# Patient Record
Sex: Male | Born: 1955 | Race: White | Hispanic: No | Marital: Married | Smoking: Former smoker
Health system: Southern US, Community
[De-identification: ages and names within clinical notes are randomized; demographics above are authoritative.]

## PROBLEM LIST (undated history)

## (undated) DIAGNOSIS — E785 Hyperlipidemia, unspecified: Secondary | ICD-10-CM

## (undated) HISTORY — PX: TONSILLECTOMY AND ADENOIDECTOMY: SUR1326

## (undated) HISTORY — PX: HERNIA REPAIR: SHX51

## (undated) HISTORY — DX: Hyperlipidemia, unspecified: E78.5

---

## 1998-07-04 HISTORY — PX: KNEE ARTHROSCOPY: SUR90

## 1999-04-16 ENCOUNTER — Encounter: Admission: RE | Admit: 1999-04-16 | Discharge: 1999-06-03 | Payer: Self-pay | Admitting: Family Medicine

## 2002-07-13 ENCOUNTER — Ambulatory Visit (HOSPITAL_COMMUNITY): Admission: RE | Admit: 2002-07-13 | Discharge: 2002-07-13 | Payer: Self-pay | Admitting: Family Medicine

## 2002-07-13 ENCOUNTER — Encounter: Payer: Self-pay | Admitting: Family Medicine

## 2006-06-12 ENCOUNTER — Ambulatory Visit: Payer: Self-pay | Admitting: Internal Medicine

## 2006-06-15 ENCOUNTER — Ambulatory Visit: Payer: Self-pay | Admitting: Internal Medicine

## 2009-11-28 ENCOUNTER — Emergency Department (HOSPITAL_COMMUNITY): Admission: EM | Admit: 2009-11-28 | Discharge: 2009-11-28 | Payer: Self-pay | Admitting: Family Medicine

## 2009-12-29 ENCOUNTER — Encounter: Admission: RE | Admit: 2009-12-29 | Discharge: 2009-12-29 | Payer: Self-pay | Admitting: Orthopedic Surgery

## 2010-09-20 LAB — ROCKY MTN SPOTTED FVR AB, IGG-BLOOD: RMSF IgG: 1.08 IV — ABNORMAL HIGH

## 2010-09-20 LAB — B. BURGDORFI ANTIBODIES: B burgdorferi Ab IgG+IgM: 0.54 {ISR}

## 2012-09-19 ENCOUNTER — Encounter: Payer: Self-pay | Admitting: General Practice

## 2012-09-19 ENCOUNTER — Ambulatory Visit (INDEPENDENT_AMBULATORY_CARE_PROVIDER_SITE_OTHER): Payer: BC Managed Care – PPO | Admitting: General Practice

## 2012-09-19 VITALS — BP 119/81 | HR 60 | Temp 97.2°F | Ht 71.0 in | Wt 207.0 lb

## 2012-09-19 DIAGNOSIS — J020 Streptococcal pharyngitis: Secondary | ICD-10-CM

## 2012-09-19 DIAGNOSIS — J329 Chronic sinusitis, unspecified: Secondary | ICD-10-CM | POA: Insufficient documentation

## 2012-09-19 MED ORDER — AMOXICILLIN 500 MG PO CAPS: 500.0000 mg | ORAL_CAPSULE | Freq: Two times a day (BID) | ORAL | Status: DC

## 2012-09-19 NOTE — Patient Instructions (Signed)
Sinusitis Sinusitis is redness, soreness, and swelling (inflammation) of the paranasal sinuses. Paranasal sinuses are air pockets within the bones of your face (beneath the eyes, the middle of the forehead, or above the eyes). In healthy paranasal sinuses, mucus is able to drain out, and air is able to circulate through them by way of your nose. However, when your paranasal sinuses are inflamed, mucus and air can become trapped. This can allow bacteria and other germs to grow and cause infection. Sinusitis can develop quickly and last only a short time (acute) or continue over a long period (chronic). Sinusitis that lasts for more than 12 weeks is considered chronic.  CAUSES  Causes of sinusitis include:  Allergies.  Structural abnormalities, such as displacement of the cartilage that separates your nostrils (deviated septum), which can decrease the air flow through your nose and sinuses and affect sinus drainage.  Functional abnormalities, such as when the small hairs (cilia) that line your sinuses and help remove mucus do not work properly or are not present. SYMPTOMS  Symptoms of acute and chronic sinusitis are the same. The primary symptoms are pain and pressure around the affected sinuses. Other symptoms include:  Upper toothache.  Earache.  Headache.  Bad breath.  Decreased sense of smell and taste.  A cough, which worsens when you are lying flat.  Fatigue.  Fever.  Thick drainage from your nose, which often is green and may contain pus (purulent).  Swelling and warmth over the affected sinuses. DIAGNOSIS  Your caregiver will perform a physical exam. During the exam, your caregiver may:  Look in your nose for signs of abnormal growths in your nostrils (nasal polyps).  Tap over the affected sinus to check for signs of infection.  View the inside of your sinuses (endoscopy) with a special imaging device with a light attached (endoscope), which is inserted into your  sinuses. If your caregiver suspects that you have chronic sinusitis, one or more of the following tests may be recommended:  Allergy tests.  Nasal culture A sample of mucus is taken from your nose and sent to a lab and screened for bacteria.  Nasal cytology A sample of mucus is taken from your nose and examined by your caregiver to determine if your sinusitis is related to an allergy. TREATMENT  Most cases of acute sinusitis are related to a viral infection and will resolve on their own within 10 days. Sometimes medicines are prescribed to help relieve symptoms (pain medicine, decongestants, nasal steroid sprays, or saline sprays).  However, for sinusitis related to a bacterial infection, your caregiver will prescribe antibiotic medicines. These are medicines that will help kill the bacteria causing the infection.  Rarely, sinusitis is caused by a fungal infection. In theses cases, your caregiver will prescribe antifungal medicine. For some cases of chronic sinusitis, surgery is needed. Generally, these are cases in which sinusitis recurs more than 3 times per year, despite other treatments. HOME CARE INSTRUCTIONS   Drink plenty of water. Water helps thin the mucus so your sinuses can drain more easily.  Use a humidifier.  Inhale steam 3 to 4 times a day (for example, sit in the bathroom with the shower running).  Apply a warm, moist washcloth to your face 3 to 4 times a day, or as directed by your caregiver.  Use saline nasal sprays to help moisten and clean your sinuses.  Take over-the-counter or prescription medicines for pain, discomfort, or fever only as directed by your caregiver. SEEK IMMEDIATE MEDICAL   CARE IF:  You have increasing pain or severe headaches.  You have nausea, vomiting, or drowsiness.  You have swelling around your face.  You have vision problems.  You have a stiff neck.  You have difficulty breathing. MAKE SURE YOU:   Understand these  instructions.  Will watch your condition.  Will get help right away if you are not doing well or get worse. Document Released: 06/20/2005 Document Revised: 09/12/2011 Document Reviewed: 07/05/2011 [facility] Patient Information 2013 [place], [place].  Sore Throat Sore throats may be caused by bacteria and viruses. They may also be caused by:  Smoking.  Pollution.  Allergies. If a sore throat is due to strep infection (a bacterial infection), you may need:  A throat swab.  A culture test to verify the strep infection. You will need one of these:  An antibiotic shot.  Oral medicine for a full 10 days. Strep infection is very contagious. A doctor should check any close contacts who have a sore throat or fever. A sore throat caused by a virus infection will usually last only 3-4 days. Antibiotics will not treat a viral sore throat.  Infectious mononucleosis (a viral disease), however, can cause a sore throat that lasts for up to 3 weeks. Mononucleosis can be diagnosed with blood tests. You must have been sick for at least 1 week in order for the test to give accurate results. HOME CARE INSTRUCTIONS   To treat a sore throat, take mild pain medicine.  Increase your fluids.  Eat a soft diet.  Do not smoke.  Gargling with warm water or salt water (1 tsp. salt in 8 oz. water) can be helpful.  Try throat sprays or lozenges or sucking on hard candy to ease the symptoms. Call your doctor if your sore throat lasts longer than 1 week.  SEEK IMMEDIATE MEDICAL CARE IF:  You have difficulty breathing.  You have increased swelling in the throat.  You have pain so severe that you are unable to swallow fluids or your saliva.  You have a severe headache, a high fever, vomiting, or a red rash. Document Released: 07/28/2004 Document Revised: 09/12/2011 Document Reviewed: 06/07/2007 [facility] Patient Information 2013 [place], [place].

## 2012-09-19 NOTE — Progress Notes (Signed)
  Subjective:    Patient ID: Spencer Stewart, male    DOB: Sep 17, 1955, 57 y.o.   MRN: [id number]  Sinusitis This is a new problem. The current episode started in the past 7 days. The problem has been gradually worsening since onset. There has been no fever. His pain is at a severity of 5/10. Associated symptoms include congestion, headaches and sinus pressure. Pertinent negatives include no chills, coughing or ear pain. Past treatments include acetaminophen. The treatment provided mild relief.  Sore Throat  This is a new problem. The current episode started in the past 7 days. The problem has been gradually worsening. The pain is worse on the left side. There has been no fever. The pain is at a severity of 7/10. The pain is moderate. Associated symptoms include congestion and headaches. Pertinent negatives include no coughing or ear pain. He has tried acetaminophen for the symptoms. The treatment provided no relief.      Review of Systems  Constitutional: Negative for chills.  HENT: Positive for congestion and sinus pressure. Negative for ear pain.   Respiratory: Negative for cough and chest tightness.   Skin: Negative for rash.  Neurological: Positive for headaches.       Objective:   Physical Exam  Constitutional: He is oriented to person, place, and time. He appears well-developed and well-nourished.  HENT:  Nose: Right sinus exhibits maxillary sinus tenderness. Left sinus exhibits maxillary sinus tenderness.  Mouth/Throat: Posterior oropharyngeal erythema present.  Cardiovascular: Normal rate, regular rhythm and normal heart sounds.   No murmur heard. Pulmonary/Chest: Effort normal and breath sounds normal.  Neurological: He is alert and oriented to person, place, and time.  Skin: Skin is warm and dry.          Assessment & Plan:  Complete antibiotics when starting to feel better Increase fluids Tylenol or motrin generalized aching Proper handwashing  Raymon Mutton,  FNP-C

## 2013-06-17 ENCOUNTER — Ambulatory Visit: Payer: BC Managed Care – PPO | Attending: Physical Medicine and Rehabilitation | Admitting: Physical Therapy

## 2013-06-17 DIAGNOSIS — R5381 Other malaise: Secondary | ICD-10-CM | POA: Insufficient documentation

## 2013-06-17 DIAGNOSIS — M545 Low back pain, unspecified: Secondary | ICD-10-CM | POA: Insufficient documentation

## 2013-06-17 DIAGNOSIS — IMO0001 Reserved for inherently not codable concepts without codable children: Secondary | ICD-10-CM | POA: Insufficient documentation

## 2013-06-18 ENCOUNTER — Ambulatory Visit: Payer: BC Managed Care – PPO | Admitting: Physical Therapy

## 2013-06-21 ENCOUNTER — Ambulatory Visit: Payer: BC Managed Care – PPO | Admitting: Physical Therapy

## 2013-06-25 ENCOUNTER — Ambulatory Visit: Payer: BC Managed Care – PPO | Admitting: Physical Therapy

## 2013-07-02 ENCOUNTER — Ambulatory Visit: Payer: BC Managed Care – PPO | Admitting: *Deleted

## 2013-07-09 ENCOUNTER — Ambulatory Visit: Payer: BC Managed Care – PPO | Attending: Physical Medicine and Rehabilitation | Admitting: *Deleted

## 2013-07-09 DIAGNOSIS — M545 Low back pain, unspecified: Secondary | ICD-10-CM | POA: Insufficient documentation

## 2013-07-09 DIAGNOSIS — R5381 Other malaise: Secondary | ICD-10-CM | POA: Insufficient documentation

## 2013-07-09 DIAGNOSIS — IMO0001 Reserved for inherently not codable concepts without codable children: Secondary | ICD-10-CM | POA: Insufficient documentation

## 2013-07-16 ENCOUNTER — Encounter: Payer: BC Managed Care – PPO | Admitting: *Deleted

## 2013-11-28 ENCOUNTER — Telehealth: Payer: Self-pay | Admitting: Family Medicine

## 2013-11-28 NOTE — Telephone Encounter (Signed)
appt given for Monday with moore

## 2013-12-02 ENCOUNTER — Ambulatory Visit (INDEPENDENT_AMBULATORY_CARE_PROVIDER_SITE_OTHER): Payer: BC Managed Care – PPO | Admitting: Family Medicine

## 2013-12-02 ENCOUNTER — Encounter: Payer: Self-pay | Admitting: Family Medicine

## 2013-12-02 VITALS — BP 126/81 | HR 72 | Temp 97.5°F | Ht 71.0 in | Wt 208.0 lb

## 2013-12-02 DIAGNOSIS — W57XXXA Bitten or stung by nonvenomous insect and other nonvenomous arthropods, initial encounter: Secondary | ICD-10-CM

## 2013-12-02 DIAGNOSIS — R1031 Right lower quadrant pain: Secondary | ICD-10-CM

## 2013-12-02 DIAGNOSIS — Z0000 Encounter for general adult medical examination without abnormal findings: Secondary | ICD-10-CM

## 2013-12-02 DIAGNOSIS — T148 Other injury of unspecified body region: Secondary | ICD-10-CM

## 2013-12-02 DIAGNOSIS — E785 Hyperlipidemia, unspecified: Secondary | ICD-10-CM

## 2013-12-02 NOTE — Patient Instructions (Signed)
Continue to use warm compresses and take over-the-counter anti-inflammatory medicine as needed If the orthopedic people do not call you by 8-10 days please give [place] a call back and we will call them again for you. Avoid any aggressive physical activity i.e. tennis

## 2013-12-02 NOTE — Progress Notes (Signed)
Subjective:    Patient ID: Spencer Stewart, male    DOB: March 03, 1956, 58 y.o.   MRN: [id number]  HPI Patient here today for possible hernia. The patient has had some increased discomfort in the right groin area. This has been going on for 6-7 years and has gotten much worse and frequent and more unbearable recently. The patient had an MRI . I called and spoke to orthopedics and they sent me a fax copy of an MRI that was focused on the left hip and not the right hip. The patient is really concerned because this is interfering with his daily lifestyle and he would like to address the problem and start feeling better.          Patient Active Problem List   Diagnosis Date Noted  . Unspecified sinusitis (chronic) 09/19/2012   No outpatient encounter prescriptions on file as of 12/02/2013.    Review of Systems  Constitutional: Negative.   HENT: Negative.   Eyes: Negative.   Respiratory: Negative.   Cardiovascular: Negative.   Gastrointestinal: Negative.   Endocrine: Negative.   Genitourinary: Negative.        Right groin area pain  Musculoskeletal: Negative.   Skin: Negative.   Allergic/Immunologic: Negative.   Neurological: Negative.   Hematological: Negative.   Psychiatric/Behavioral: Negative.        Objective:   Physical Exam  Nursing note and vitals reviewed. Constitutional: He is oriented to person, place, and time. He appears well-developed and well-nourished. No distress.  HENT:  Head: Normocephalic.  Eyes: Conjunctivae and EOM are normal. Pupils are equal, round, and reactive to light. Right eye exhibits no discharge. Left eye exhibits no discharge. No scleral icterus.  Neck: Normal range of motion.  Pulmonary/Chest: Effort normal and breath sounds normal.  Abdominal: Soft. Bowel sounds are normal. He exhibits no mass. There is no tenderness. There is no rebound and no guarding.  Genitourinary: Penis normal.  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 He exhibits no edema and no tenderness.  There is pain in the anterior hip with direct palpation. There was pain in the right groin with abduction and abduction of the right hip. Worse with abduction against pressure. There were no masses. There were no inguinal nodes  Lymphadenopathy:    He has no cervical adenopathy.  Neurological: He is alert and oriented to person, place, and time.  Skin: Skin is warm and dry. No rash noted.  Psychiatric: He has a normal mood and affect. His behavior is normal. Judgment and thought content normal.    There is no sign of any inguinal hernia bilaterally. BP 126/81  Pulse 72  Temp(Src) 97.5 F (36.4 C) (Oral)  Ht 5' 11" (1.803 m)  Wt 208 lb (94.348 kg)  BMI 29.02 kg/m2  Please see fax copy of x-ray report of left MRI from the orthopedic      Assessment & Plan:  1. Hyperlipidemia - POCT CBC; Future - BMP8+EGFR; Future - Hepatic function panel; Future - NMR, lipoprofile; Future - Vit D  25 hydroxy (rtn osteoporosis monitoring); Future - Lyme Ab/Western Blot Reflex; Future - Rocky mtn spotted fvr abs pnl(IgG+IgM); Future  2. Healthcare maintenance - POCT CBC; Future - BMP8+EGFR; Future - Hepatic function panel; Future - NMR, lipoprofile; Future - Vit D  25 hydroxy (rtn osteoporosis monitoring); Future - Lyme Ab/Western Blot Reflex; Future - Rocky mtn spotted fvr abs pnl(IgG+IgM); Future  3. Tick bite - Lyme Ab/Western Blot Reflex; Future - Rocky mtn spotted fvr abs pnl(IgG+IgM); Future  4. Severe right groin pain -Awaiting appointment from Dr. Olin  Patient Instructions  Continue to use warm compresses and take over-the-counter anti-inflammatory medicine as needed If the orthopedic people do not call you by 8-10 days please give us a call back and we will call them again for you. Avoid any aggressive physical activity i.e. tennis   Don W. Moore MD   

## 2013-12-03 ENCOUNTER — Other Ambulatory Visit (INDEPENDENT_AMBULATORY_CARE_PROVIDER_SITE_OTHER): Payer: BC Managed Care – PPO

## 2013-12-03 DIAGNOSIS — W57XXXA Bitten or stung by nonvenomous insect and other nonvenomous arthropods, initial encounter: Secondary | ICD-10-CM

## 2013-12-03 DIAGNOSIS — E291 Testicular hypofunction: Secondary | ICD-10-CM

## 2013-12-03 DIAGNOSIS — E785 Hyperlipidemia, unspecified: Secondary | ICD-10-CM

## 2013-12-03 DIAGNOSIS — R7989 Other specified abnormal findings of blood chemistry: Secondary | ICD-10-CM

## 2013-12-03 DIAGNOSIS — Z0000 Encounter for general adult medical examination without abnormal findings: Secondary | ICD-10-CM

## 2013-12-03 LAB — POCT CBC
Granulocyte percent: 59.8 %G (ref 37–80)
HEMATOCRIT: 50.6 % (ref 43.5–53.7)
Hemoglobin: 16.1 g/dL (ref 14.1–18.1)
Lymph, poc: 2.6 (ref 0.6–3.4)
MCH: 28.1 pg (ref 27–31.2)
MCHC: 31.9 g/dL (ref 31.8–35.4)
MCV: 88 fL (ref 80–97)
MPV: 7.4 fL (ref 0–99.8)
POC Granulocyte: 4.5 (ref 2–6.9)
POC LYMPH PERCENT: 34.4 %L (ref 10–50)
Platelet Count, POC: 266 10*3/uL (ref 142–424)
RBC: 5.8 M/uL (ref 4.69–6.13)
RDW, POC: 13.8 %
WBC: 7.6 10*3/uL (ref 4.6–10.2)

## 2013-12-04 ENCOUNTER — Other Ambulatory Visit (INDEPENDENT_AMBULATORY_CARE_PROVIDER_SITE_OTHER): Payer: BC Managed Care – PPO

## 2013-12-04 DIAGNOSIS — R7989 Other specified abnormal findings of blood chemistry: Secondary | ICD-10-CM

## 2013-12-04 LAB — POCT GLYCOSYLATED HEMOGLOBIN (HGB A1C): Hemoglobin A1C: 5.5

## 2013-12-04 NOTE — Addendum Note (Signed)
Addended by: Orma Render F on: 12/04/2013 11:40 AM   Modules accepted: Orders

## 2013-12-05 LAB — CMP14+EGFR
ALBUMIN: 4.5 g/dL (ref 3.5–5.5)
ALK PHOS: 97 IU/L (ref 39–117)
ALT: 51 IU/L — ABNORMAL HIGH (ref 0–44)
AST: 56 IU/L — AB (ref 0–40)
Albumin/Globulin Ratio: 2 (ref 1.1–2.5)
BILIRUBIN TOTAL: 0.7 mg/dL (ref 0.0–1.2)
BUN / CREAT RATIO: 20 (ref 9–20)
BUN: 22 mg/dL (ref 6–24)
CO2: 25 mmol/L (ref 18–29)
CREATININE: 1.12 mg/dL (ref 0.76–1.27)
Calcium: 9.6 mg/dL (ref 8.7–10.2)
Chloride: 99 mmol/L (ref 97–108)
GFR calc non Af Amer: 72 mL/min/{1.73_m2} (ref >59)
GFR, EST AFRICAN AMERICAN: 83 mL/min/{1.73_m2} (ref >59)
GLOBULIN, TOTAL: 2.2 g/dL (ref 1.5–4.5)
Glucose: 79 mg/dL (ref 65–99)
Potassium: 5.1 mmol/L (ref 3.5–5.2)
SODIUM: 139 mmol/L (ref 134–144)
Total Protein: 6.7 g/dL (ref 6.0–8.5)

## 2013-12-06 LAB — VITAMIN D 25 HYDROXY (VIT D DEFICIENCY, FRACTURES): VIT D 25 HYDROXY: 30.2 ng/mL (ref 30.0–100.0)

## 2013-12-06 LAB — LYME, WESTERN BLOT, SERUM (REFLEXED)
IGG P28 AB.: ABSENT
IGG P45 AB.: ABSENT
IGG P58 AB.: ABSENT
IGG P66 AB.: ABSENT
IGG P93 AB.: ABSENT
IGM P39 AB.: ABSENT
IGM P41 AB.: ABSENT
IgG P18 Ab.: ABSENT
IgG P23 Ab.: ABSENT
IgG P30 Ab.: ABSENT
IgG P39 Ab.: ABSENT
LYME IGG WB: NEGATIVE
LYME IGM WB: NEGATIVE

## 2013-12-06 LAB — BMP8+EGFR
BUN / CREAT RATIO: 18 (ref 9–20)
BUN: 22 mg/dL (ref 6–24)
CHLORIDE: 99 mmol/L (ref 97–108)
CO2: 26 mmol/L (ref 18–29)
Calcium: 10 mg/dL (ref 8.7–10.2)
Creatinine, Ser: 1.2 mg/dL (ref 0.76–1.27)
GFR, EST AFRICAN AMERICAN: 77 mL/min/{1.73_m2} (ref >59)
GFR, EST NON AFRICAN AMERICAN: 66 mL/min/{1.73_m2} (ref >59)
Glucose: 110 mg/dL — ABNORMAL HIGH (ref 65–99)
POTASSIUM: 6 mmol/L — AB (ref 3.5–5.2)
Sodium: 142 mmol/L (ref 134–144)

## 2013-12-06 LAB — HEPATIC FUNCTION PANEL
ALK PHOS: 100 IU/L (ref 39–117)
ALT: 51 IU/L — ABNORMAL HIGH (ref 0–44)
AST: 53 IU/L — AB (ref 0–40)
Albumin: 4.5 g/dL (ref 3.5–5.5)
BILIRUBIN DIRECT: 0.17 mg/dL (ref 0.00–0.40)
Total Bilirubin: 0.5 mg/dL (ref 0.0–1.2)
Total Protein: 6.9 g/dL (ref 6.0–8.5)

## 2013-12-06 LAB — NMR, LIPOPROFILE
Cholesterol: 175 mg/dL (ref 100–199)
HDL Cholesterol by NMR: 14 mg/dL — ABNORMAL LOW (ref >39)
HDL PARTICLE NUMBER: 13 umol/L — AB (ref >=30.5)
LDL PARTICLE NUMBER: 1689 nmol/L — AB (ref <1000)
LDL SIZE: 20.6 nm (ref >20.5)
LDLC SERPL CALC-MCNC: 138 mg/dL — AB (ref 0–99)
LP-IR SCORE: 49 — AB (ref <=45)
Small LDL Particle Number: 671 nmol/L — ABNORMAL HIGH (ref <=527)
Triglycerides by NMR: 113 mg/dL (ref 0–149)

## 2013-12-06 LAB — TESTOSTERONE,FREE AND TOTAL
TESTOSTERONE: 104 ng/dL — AB (ref 348–1197)
Testosterone, Free: 12.3 pg/mL (ref 7.2–24.0)

## 2013-12-06 LAB — LYME AB/WESTERN BLOT REFLEX
LYME DISEASE AB, QUANT, IGM: 1.06 {index} — AB (ref 0.00–0.79)
Lyme IgG/IgM Ab: <0.91 {ISR} (ref 0.00–0.90)

## 2013-12-06 LAB — ROCKY MTN SPOTTED FVR ABS PNL(IGG+IGM)
RMSF IGG: POSITIVE — AB
RMSF IgM: 0.8 index (ref 0.00–0.89)

## 2013-12-06 LAB — RMSF, IGG, IFA

## 2013-12-07 ENCOUNTER — Telehealth: Payer: Self-pay | Admitting: *Deleted

## 2013-12-07 MED ORDER — DOXYCYCLINE HYCLATE 100 MG PO TABS: 100.0000 mg | ORAL_TABLET | Freq: Two times a day (BID) | ORAL | Status: DC

## 2013-12-07 NOTE — Telephone Encounter (Signed)
Notified pt of results Doxycycline sent into [facility] in [place] per Dr Christell Constant Informed pt that the Drug store is closed on Sat at 12 but pt still wanted RX sent in there

## 2013-12-13 ENCOUNTER — Telehealth: Payer: Self-pay | Admitting: Family Medicine

## 2013-12-13 NOTE — Telephone Encounter (Signed)
appt given Monday with moore

## 2013-12-16 ENCOUNTER — Encounter (INDEPENDENT_AMBULATORY_CARE_PROVIDER_SITE_OTHER): Payer: BC Managed Care – PPO | Admitting: Family Medicine

## 2013-12-16 ENCOUNTER — Encounter: Payer: Self-pay | Admitting: Family Medicine

## 2013-12-16 VITALS — BP 128/89 | HR 73 | Temp 98.0°F | Ht 71.0 in | Wt 201.0 lb

## 2013-12-16 DIAGNOSIS — E291 Testicular hypofunction: Secondary | ICD-10-CM

## 2013-12-16 DIAGNOSIS — R7989 Other specified abnormal findings of blood chemistry: Secondary | ICD-10-CM

## 2013-12-16 DIAGNOSIS — R945 Abnormal results of liver function studies: Secondary | ICD-10-CM

## 2013-12-16 DIAGNOSIS — E875 Hyperkalemia: Secondary | ICD-10-CM

## 2013-12-16 NOTE — Progress Notes (Signed)
This encounter was created in error - please disregard.

## 2013-12-16 NOTE — Progress Notes (Deleted)
   Subjective:    Patient ID: Spencer Stewart, male    DOB: Jan 04, 1956, 58 y.o.   MRN: [id number]  HPI Patient here today for follow up from tick bite and recheck of testosterone level and potassium level today.         Patient Active Problem List   Diagnosis Date Noted  . Hyperlipidemia 12/02/2013  . Unspecified sinusitis (chronic) 09/19/2012   Outpatient Encounter Prescriptions as of 12/16/2013  Medication Sig  . doxycycline (VIBRA-TABS) 100 MG tablet Take 1 tablet (100 mg total) by mouth 2 (two) times daily.    Review of Systems  Constitutional: Negative.   HENT: Negative.   Eyes: Negative.   Respiratory: Negative.   Cardiovascular: Negative.   Gastrointestinal: Negative.   Endocrine: Negative.   Genitourinary: Negative.   Musculoskeletal: Negative.   Skin: Negative.   Allergic/Immunologic: Negative.   Neurological: Negative.   Hematological: Negative.   Psychiatric/Behavioral: Negative.        Objective:   Physical Exam BP 128/89  Pulse 73  Temp(Src) 98 F (36.7 C) (Oral)  Ht 5\' 11"  (1.803 m)  Wt 201 lb (91.173 kg)  BMI 28.05 kg/m2        Assessment & Plan:

## 2013-12-17 LAB — CMP14+EGFR
A/G RATIO: 1.9 (ref 1.1–2.5)
ALK PHOS: 86 IU/L (ref 39–117)
ALT: 113 IU/L — AB (ref 0–44)
AST: 58 IU/L — ABNORMAL HIGH (ref 0–40)
Albumin: 4.4 g/dL (ref 3.5–5.5)
BILIRUBIN TOTAL: 0.6 mg/dL (ref 0.0–1.2)
BUN / CREAT RATIO: 21 — AB (ref 9–20)
BUN: 23 mg/dL (ref 6–24)
CHLORIDE: 102 mmol/L (ref 97–108)
CO2: 23 mmol/L (ref 18–29)
Calcium: 9.7 mg/dL (ref 8.7–10.2)
Creatinine, Ser: 1.11 mg/dL (ref 0.76–1.27)
GFR, EST AFRICAN AMERICAN: 84 mL/min/{1.73_m2} (ref >59)
GFR, EST NON AFRICAN AMERICAN: 73 mL/min/{1.73_m2} (ref >59)
GLUCOSE: 118 mg/dL — AB (ref 65–99)
Globulin, Total: 2.3 g/dL (ref 1.5–4.5)
POTASSIUM: 5 mmol/L (ref 3.5–5.2)
Sodium: 138 mmol/L (ref 134–144)
TOTAL PROTEIN: 6.7 g/dL (ref 6.0–8.5)

## 2013-12-17 LAB — TESTOSTERONE,FREE AND TOTAL
TESTOSTERONE: 160 ng/dL — AB (ref 348–1197)
Testosterone, Free: 18.3 pg/mL (ref 7.2–24.0)

## 2013-12-18 ENCOUNTER — Telehealth: Payer: Self-pay | Admitting: *Deleted

## 2013-12-18 NOTE — Telephone Encounter (Signed)
Message copied by Bearl MulberryUTHERFORD, NATALIE K on Wed Dec 18, 2013 10:40 AM ------      Message from: Ernestina PennaMOORE, DONALD W      Created: Sat Dec 07, 2013  8:06 AM       2 liver function tests are slightly elevated      The total LDL particle number was elevated at 1689. The LDL C. is elevated at 138. The LDL particle number should be less than 1000. Triglycerides are good. The HDL particle number or the good cholesterol is very low.--------- please schedule this patient an appointment with the clinical pharmacist to discuss diet management and treatment of his elevated LDL cholesterol      The vitamin D level is at the low end of the normal range, start vitamin D3 1000 daily, [facility] or the drugstore in [place]      The total testosterone level is low, the free direct testosterone is within normal limits      [facility] fever fire is suggestive of a recent or active infection and this patient should be treated with doxycycline 100 mg twice daily for 3 weeks      The titer for Lyme disease is equivocal which means a slightly elevated titer but not enough to be confirming of active Lyme disease,--the doxycycline should take care of this also      Please call the patient with these results and schedule an appointment with the clinical pharmacist and especially call the antibiotic and for him to take             ------

## 2013-12-18 NOTE — Telephone Encounter (Signed)
Pt notified of lab results Verbalizes understanding Copy of labs to front for pt pick up

## 2014-01-23 ENCOUNTER — Ambulatory Visit: Payer: BC Managed Care – PPO | Admitting: Family Medicine

## 2014-01-27 ENCOUNTER — Ambulatory Visit: Payer: BC Managed Care – PPO | Admitting: Family Medicine

## 2014-02-03 ENCOUNTER — Ambulatory Visit (INDEPENDENT_AMBULATORY_CARE_PROVIDER_SITE_OTHER): Payer: BC Managed Care – PPO | Admitting: General Surgery

## 2014-02-03 DIAGNOSIS — S3981XA Other specified injuries of abdomen, initial encounter: Secondary | ICD-10-CM | POA: Insufficient documentation

## 2014-02-03 DIAGNOSIS — IMO0002 Reserved for concepts with insufficient information to code with codable children: Secondary | ICD-10-CM

## 2014-02-03 NOTE — Patient Instructions (Signed)
Please call if you would like to schedule the surgery.

## 2014-02-03 NOTE — Progress Notes (Signed)
Patient ID: Spencer Stewart, male   DOB: [birth year], 58 y.o.   MRN: [id number]  No chief complaint on file.   HPI Spencer EdwardsCarl R Stewart is a 58 y.o. male.   HPI  He is referred by Dr. Lajoyce CornersMatt Olin because of right groin pain and concern for a right inguinal sports hernia.  When he was in his late 2620s, he suffered a severe right groin strain. There was since that time, intermittently he gets some discomfort. He likes to play tennis. He does lateral movement he tends to get some pain. He's had some episodes of severe pain while playing tennis and has rested for up to 14 weeks.  He is rested for shorter periods of time in the past. Each time when he goes back to playing tennis he has the same pain. He had an MRI in 2011 which was suggestive of a right inguinal sports-type hernia. He has done a lot of reading on the subject. He is here to discuss surgical treatment of this. He is able to walk without difficulty. He is able to lift weights. Lateral movement is what brings on the pain.  No past medical history on file.  PSH-Left knee surgery  No family history on file.  Social History History  Substance Use Topics  . Smoking status: Former Games developermoker  . Smokeless tobacco: Not on file  . Alcohol Use: No    Allergies  Allergen Reactions  . Crestor [Rosuvastatin] Other (See Comments)    Causes myalgia    Current Outpatient Prescriptions  Medication Sig Dispense Refill  . doxycycline (VIBRA-TABS) 100 MG tablet Take 1 tablet (100 mg total) by mouth 2 (two) times daily.  42 tablet  0   No current facility-administered medications for this visit.    Review of Systems Review of Systems  Constitutional: Negative.   HENT: Negative.   Respiratory: Negative.   Cardiovascular: Negative.   Gastrointestinal: Negative.   Genitourinary: Negative for difficulty urinating.       Right groin pain  Neurological: Negative.     There were no vitals taken for this visit.  Physical Exam Physical Exam   Constitutional: He appears well-developed and well-nourished. No distress.  HENT:  Head: Normocephalic and atraumatic.  Abdominal: Soft. He exhibits no distension and no mass. There is no tenderness.  Genitourinary:  No right or left inguinal bulge. 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 External wing on the right is wider than on the left and somewhat tender.  Musculoskeletal: He exhibits no edema.  Neurological: He is alert.  Skin: Skin is warm and dry.  Psychiatric: He has a normal mood and affect. His behavior is normal.    Data Reviewed MRI report. Note from Dr. Charlann Boxerlin.  Assessment    Right groin pain with findings consistent with right inguinal sports hernia.     Plan    We discussed repair of the right inguinal sports hernia with mesh. We discussed the success rate and the postoperative care. I have explained the procedure, risks, and aftercare.  Risks include but are not limited to bleeding, infection, wound problems, anesthesia, recurrence, bladder or intestine injury, urinary retention, testicular dysfunction, chronic pain, mesh problems ,failure to relieve the pain.  He seems to understand.  He is not sure that he wants to pursue surgery at this time will call back if he would like to.       Erasmus Bistline J 02/03/2014, 6:14 PM

## 2014-07-14 ENCOUNTER — Ambulatory Visit (INDEPENDENT_AMBULATORY_CARE_PROVIDER_SITE_OTHER): Payer: BLUE CROSS/BLUE SHIELD | Admitting: Family Medicine

## 2014-07-14 ENCOUNTER — Encounter: Payer: Self-pay | Admitting: Family Medicine

## 2014-07-14 VITALS — BP 158/89 | HR 83 | Temp 96.7°F | Ht 71.0 in | Wt 210.8 lb

## 2014-07-14 DIAGNOSIS — J206 Acute bronchitis due to rhinovirus: Secondary | ICD-10-CM

## 2014-07-14 MED ORDER — AMOXICILLIN 875 MG PO TABS: 875.0000 mg | ORAL_TABLET | Freq: Two times a day (BID) | ORAL | Status: DC

## 2014-07-14 MED ORDER — METHYLPREDNISOLONE ACETATE 80 MG/ML IJ SUSP: 80.0000 mg | Freq: Once | INTRAMUSCULAR | Status: DC

## 2014-07-14 MED ORDER — BENZONATATE 100 MG PO CAPS: 200.0000 mg | ORAL_CAPSULE | Freq: Three times a day (TID) | ORAL | Status: DC | PRN

## 2014-07-14 MED ORDER — LEVALBUTEROL HCL 1.25 MG/0.5ML IN NEBU
1.2500 mg | INHALATION_SOLUTION | RESPIRATORY_TRACT | Status: AC
  Administered 2014-07-14: 1.25 mg via RESPIRATORY_TRACT

## 2014-07-14 MED ORDER — METHYLPREDNISOLONE ACETATE 40 MG/ML IJ SUSP
80.0000 mg | Freq: Once | INTRAMUSCULAR | Status: AC
  Administered 2014-07-14: 80 mg via INTRAMUSCULAR

## 2014-07-14 NOTE — Progress Notes (Signed)
   Subjective:    Patient ID: Spencer Stewart, male    DOB: 06-Jan-1956, 59 y.o.   MRN: [id number]  HPI Patient is here with CC of cough and URI sx's.  Review of Systems  Constitutional: Negative for fever.  HENT: Negative for ear pain.   Eyes: Negative for discharge.  Respiratory: Negative for cough.   Cardiovascular: Negative for chest pain.  Gastrointestinal: Negative for abdominal distention.  Endocrine: Negative for polyuria.  Genitourinary: Negative for difficulty urinating.  Musculoskeletal: Negative for gait problem and neck pain.  Skin: Negative for color change and rash.  Neurological: Negative for speech difficulty and headaches.  Psychiatric/Behavioral: Negative for agitation.       Objective:    BP 158/89 mmHg  Pulse 83  Temp(Src) 96.7 F (35.9 C) (Oral)  Ht 5\' 11"  (1.803 m)  Wt 210 lb 12.8 oz (95.618 kg)  BMI 29.41 kg/m2 Physical Exam  Constitutional: He is oriented to person, place, and time. He appears well-developed and well-nourished.  HENT:  Head: Normocephalic and atraumatic.  Mouth/Throat: Oropharynx is clear and moist.  Eyes: Pupils are equal, round, and reactive to light.  Neck: Normal range of motion. Neck supple.  Cardiovascular: Normal rate and regular rhythm.   No murmur heard. Pulmonary/Chest: Effort normal and breath sounds normal.  Abdominal: Soft. Bowel sounds are normal. There is no tenderness.  Neurological: He is alert and oriented to person, place, and time.  Skin: Skin is warm and dry.  Psychiatric: He has a normal mood and affect.          Assessment & Plan:     ICD-9-CM ICD-10-CM   1. Acute bronchitis due to Rhinovirus 466.0 J20.6 methylPREDNISolone acetate (DEPO-MEDROL) injection 80 mg   079.3  benzonatate (TESSALON PERLES) 100 MG capsule     amoxicillin (AMOXIL) 875 MG tablet   Push po fluids, rest, tylenol and motrin otc prn as directed for fever, arthralgias, and myalgias.  Follow up prn if sx's continue or  persist.  Return if symptoms worsen or fail to improve.  Deatra CanterWilliam J Oxford FNP

## 2014-07-14 NOTE — Addendum Note (Signed)
Addended by: Fawn KirkHOLT, CATHY on: 07/14/2014 12:43 PM   Modules accepted: Orders

## 2014-09-18 ENCOUNTER — Telehealth: Payer: Self-pay | Admitting: Family Medicine

## 2014-09-18 NOTE — Telephone Encounter (Signed)
Appointment given for tomorrow with [facility].  

## 2014-09-19 ENCOUNTER — Encounter: Payer: Self-pay | Admitting: Family Medicine

## 2014-09-19 ENCOUNTER — Ambulatory Visit (INDEPENDENT_AMBULATORY_CARE_PROVIDER_SITE_OTHER): Payer: BLUE CROSS/BLUE SHIELD | Admitting: Family Medicine

## 2014-09-19 VITALS — BP 136/87 | HR 80 | Temp 97.6°F | Ht 71.0 in | Wt 210.0 lb

## 2014-09-19 DIAGNOSIS — M4806 Spinal stenosis, lumbar region: Secondary | ICD-10-CM | POA: Diagnosis not present

## 2014-09-19 DIAGNOSIS — M48061 Spinal stenosis, lumbar region without neurogenic claudication: Secondary | ICD-10-CM

## 2014-09-19 MED ORDER — ORPHENADRINE CITRATE ER 100 MG PO TB12: 100.0000 mg | ORAL_TABLET | Freq: Two times a day (BID) | ORAL | Status: DC

## 2014-09-19 MED ORDER — DICLOFENAC SODIUM 75 MG PO TBEC: 75.0000 mg | DELAYED_RELEASE_TABLET | Freq: Two times a day (BID) | ORAL | Status: DC

## 2014-09-19 NOTE — Progress Notes (Signed)
Subjective:  Patient ID: Spencer Stewart, male    DOB: 1955/08/05  Age: 60 y.o. MRN: [id number]  CC: Back Pain   HPI Spencer Stewart presents for lumbar pain. Onset 4 days ago. Moderately severe. Unable to sit. He can stand for short times and lay down as long as he stays on his back. Pain is midline lumbar 3 through 5. History8-9/10 Spencer Stewart has no past medical history on file.   He has past surgical history that includes Knee arthroscopy (Left, 2000) and Tonsillectomy and adenoidectomy.   His family history is not on file.He reports that he has quit smoking. He does not have any smokeless tobacco history on file. He reports that he does not drink alcohol or use illicit drugs.  No current outpatient prescriptions on file prior to visit.   No current facility-administered medications on file prior to visit.    ROS Review of Systems  Constitutional: Negative for fever, chills, diaphoresis and unexpected weight change.  HENT: Negative for congestion, hearing loss, rhinorrhea, sore throat and trouble swallowing.   Respiratory: Negative for cough, chest tightness, shortness of breath and wheezing.   Gastrointestinal: Negative for nausea, vomiting, abdominal pain, diarrhea, constipation and abdominal distention.  Endocrine: Negative for cold intolerance and heat intolerance.  Genitourinary: Negative for dysuria, hematuria and flank pain.  Musculoskeletal: Negative for joint swelling and arthralgias.  Skin: Negative for rash.  Neurological: Negative for dizziness and headaches.  Psychiatric/Behavioral: Negative for dysphoric mood, decreased concentration and agitation. The patient is not nervous/anxious.     Objective:  BP 136/87 mmHg  Pulse 80  Temp(Src) 97.6 F (36.4 C) (Oral)  Ht  (1.803 m)  Wt 210 lb (95.255 kg)  BMI 29.30 kg/m2  BP Readings from Last 3 Encounters:  09/19/14 136/87  07/14/14 158/89  12/16/13 128/89    Wt Readings from Last 3 Encounters:  09/19/14 210  lb (95.255 kg)  07/14/14 210 lb 12.8 oz (95.618 kg)  12/16/13 201 lb (91.173 kg)     Physical Exam  Constitutional: He is oriented to person, place, and time. He appears well-developed and well-nourished. No distress.  HENT:  Head: Normocephalic and atraumatic.  Right Ear: External ear normal.  Left Ear: External ear normal.  Nose: Nose normal.  Mouth/Throat: Oropharynx is clear and moist.  Eyes: Conjunctivae and EOM are normal. Pupils are equal, round, and reactive to light.  Neck: Normal range of motion. Neck supple. No thyromegaly present.  Cardiovascular: Normal rate, regular rhythm and normal heart sounds.   No murmur heard. Pulmonary/Chest: Effort normal and breath sounds normal. No respiratory distress. He has no wheezes. He has no rales.  Abdominal: Soft. Bowel sounds are normal. He exhibits no distension. There is no tenderness.  Musculoskeletal:  Straight leg raise is positive bilaterally. Patrick's sign negative. Both lower extremities are neurovascularly intact.  Lymphadenopathy:    He has no cervical adenopathy.  Neurological: He is alert and oriented to person, place, and time. He has normal reflexes.  Skin: Skin is warm and dry.  Psychiatric: He has a normal mood and affect. His behavior is normal. Judgment and thought content normal.    Lab Results  Component Value Date   HGBA1C 5.5% 12/04/2013    Lab Results  Component Value Date   WBC 7.6 12/03/2013   HGB 16.1 12/03/2013   HCT 50.6 12/03/2013   GLUCOSE 118* 12/16/2013   CHOL 175 12/03/2013   TRIG 113 12/03/2013   HDL 14* 12/03/2013   LDLCALC  138* 12/03/2013   ALT 113* 12/16/2013   AST 58* 12/16/2013   NA 138 12/16/2013   K 5.0 12/16/2013   CL 102 12/16/2013   CREATININE 1.11 12/16/2013   BUN 23 12/16/2013   CO2 23 12/16/2013   HGBA1C 5.5% 12/04/2013    Dg Fluoro Guide Ndl Plc/bx  12/29/2009   Clinical Data:  Intermittent right hip pain.  In addition to the arthrogram, Dr. Thurston HoleWainer has also  requested a steroid injection.   Fluoroscopy Time: 13 seconds   RIGHT HIP INJECTION UNDER FLUOROSCOPY   Technique:  An appropriate skin entrance site was determined. The site was marked, prepped with Betadine, draped in the usual sterile fashion, and infiltrated locally with 1% Lidocaine.  A 22 gauge spinal needle was advanced to the superolateral margin of the femoral head under intermittent fluoroscopy.  1 ml of Lidocaine injected easily.  A mixture of 0.1 ml Multihance in 20 ml of dilute Conray 60 was then used to opacify the right hip joint.  I also injected 120 mg of Depo-Medrol.  No immediate complication. IMPRESSION: Technically successful right hip injection for MRI.   I also injected 120 mg of Depo-Medrol.  Provider: Valli Glanceandice Demaegd  Mr Extrem Low Jt*r* W/cm  12/30/2009   Clinical Data: Bilateral hip pain.  Ossific structure on the left hip.   MRI ARTHROGRAM OF THE RIGHT HIP WITH INTRA-ARTICULAR CONTRAST   Technique:  Multiplanar, multisequence MR imaging of the right hip was performed following injection of dilute gadolinium contrast medium into the right hip joint. Supplementary images to characterize the left hip were also obtained as requested.   Comparison: None   Findings:  No findings of hip avascular necrosis or abnormal edema in either proximal femur.  Contrast medium is present in the right hip joint, without abnormal extension in the right labrum to suggest labral tear.   There is low-level edema in the right pubic body on image 20 of series 6.  Linear fluid signal intensity extending within the right side of the pubic symphysis and along the inferior margin of the right pubic body are consistent with primary and secondary cleft signs of pubic instability extending along the origin of the adductor longus muscle.   No right-sided bursitis is identified.  No definite sciatic nerve impingement is seen.   No abnormal filling defect is seen in the right hip joint.   On the left side, several  degenerative subcortical cystic lesions are present in the anterior acetabulum.  No definite left acetabular labral tear is identified, although contrast medium was not injected into the left hip joint.   Immediately along the anteromedial margin of the gluteus medius and above the greater trochanter, a 3.3 x 2.4 by 2.6 cm ossific structure is present, with a thickened and slightly irregular marginal cortex and internal marrow signal.  This is shown on images 12-14 of series #7, and has an appearance most characteristic of heterotopic ossification.  No significant soft tissue component of this lesion is identified, and the lesion resides just anterior to the gluteus medius tendon as shown on images 18-21 of series 8.  No significant abnormal increased T2 signal is identified in this lesion, and the likelihood of malignancy is considered low.   There is evidence of lumbar spondylosis and degenerative disc disease.  The hamstring origination sites appear normal and symmetric.  No free pelvic fluid is identified.  No significant asymmetry of the prostate gland is noted.   IMPRESSION:   1.  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 2.  No labral tear identified. 3.  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 4.  Suspected lumbar spondylosis and degenerative disc disease.  Provider: Fabiola Backer   Assessment & Plan:   Jaccob was seen today for back pain.  Diagnoses and all orders for this visit:  Spinal stenosis of lumbar region Orders: -     Ambulatory referral to Physical Therapy  Other orders -     orphenadrine (NORFLEX) 100 MG tablet; Take 1 tablet (100 mg total) by mouth 2 (two) times daily. -     diclofenac (VOLTAREN) 75 MG EC tablet; Take 1 tablet (75 mg total) by mouth 2 (two) times daily.   I have discontinued Mr. Sackmann benzonatate and amoxicillin. I am also having him start on  orphenadrine and diclofenac.  Meds ordered this encounter  Medications  . orphenadrine (NORFLEX) 100 MG tablet    Sig: Take 1 tablet (100 mg total) by mouth 2 (two) times daily.    Dispense:  60 tablet    Refill:  5  . diclofenac (VOLTAREN) 75 MG EC tablet    Sig: Take 1 tablet (75 mg total) by mouth 2 (two) times daily.    Dispense:  60 tablet    Refill:  2     Follow-up: Return if symptoms worsen or fail to improve.  Mechele Claude, M.D.

## 2014-10-06 ENCOUNTER — Ambulatory Visit: Payer: Self-pay | Admitting: Physical Therapy

## 2014-10-08 ENCOUNTER — Ambulatory Visit: Payer: BLUE CROSS/BLUE SHIELD | Attending: Family Medicine | Admitting: Physical Therapy

## 2014-10-08 DIAGNOSIS — M545 Low back pain: Secondary | ICD-10-CM | POA: Diagnosis present

## 2014-10-08 NOTE — Therapy (Signed)
[facility] [address] [place], [place], [address] Phone: [phone]   Fax:  [phone]  Physical Therapy Evaluation  Patient Details  Name: Spencer Stewart MRN: [id number] Date of Birth: 09-Oct-1955 Referring Provider:  Mechele Claude, MD  Encounter Date: 10/08/2014      PT End of Session - 10/08/14 0905    Visit Number 1   Number of Visits 12   Date for PT Re-Evaluation 12/03/14   PT Start Time 0905   PT Stop Time 0952   PT Time Calculation (min) 47 min   Activity Tolerance Patient tolerated treatment well   Behavior During Therapy [facility] for tasks assessed/performed      No past medical history on file.  Past Surgical History  Procedure Laterality Date  . Knee arthroscopy Left 2000  . Tonsillectomy and adenoidectomy      There were no vitals filed for this visit.  Visit Diagnosis:  Right low back pain, with sciatica presence unspecified - Plan: PT plan of care cert/re-cert      Subjective Assessment - 10/08/14 0908    Subjective Flare-up approximately 4+ weeks ago.   Currently in Pain? Yes   Pain Score 6    Pain Location Back   Pain Orientation Right   Pain Descriptors / Indicators Aching   Pain Type --  Sub-acute.   Pain Onset More than a month ago   Pain Frequency Constant   Aggravating Factors  Sleeping and increased activity.   Pain Relieving Factors rest.   Multiple Pain Sites No            [facility] PT Assessment - 10/08/14 0001    Assessment   Medical Diagnosis Lumbar stenosis   Onset Date --  4+ weeks ago.   Precautions   Precautions None   Balance Screen   Has the patient fallen in the past 6 months No   Has the patient had a decrease in activity level because of a fear of falling?  No   Is the patient reluctant to leave their home because of a fear of falling?  No   Posture/Postural Control   Posture/Postural Control --   Posture Comments Mild left convexity   ROM / Strength   AROM / PROM /  Strength AROM;Strength   AROM   Overall AROM Comments Normal spinal mobility.   Strength   Overall Strength --  Normal LE strength.   Palpation   Palpation --  C/o pain with overpressure at L5-S1.   Special Tests    Special Tests --  1+/4+ left Achilles reflex.                   [facility] Adult PT Treatment/Exercise - 10/08/14 0001    Modalities   Modalities Traction   Traction   Type of Traction Lumbar   Min (lbs) 5   Max (lbs) 100   Hold Time 99   Rest Time 5   Time 15                PT Education - 10/08/14 0954    Education provided Yes   Person(s) Educated Patient   Methods Explanation   Comprehension Verbalized understanding             PT Long Term Goals - 10/08/14 0955    PT LONG TERM GOAL #1   Title Ind with HEP.   Time 6   Period Weeks   Status New   PT LONG  TERM GOAL #2   Title Eliminate right LE pain.   Time 6   Period Weeks   Status New   PT LONG TERM GOAL #3   Title Perform ADL's with pain not > 2-3/10.   Time 6   Period Weeks   Status New   PT LONG TERM GOAL #4   Title Sleep undisturbed 6 hours.   Time 6   Period Weeks   Status New               Plan - 10/08/14 0945    Clinical Impression Statement The patient reports a major flare-up over 4 weeks ago.  Low back pain with pain into right buttock to posterior thigh to kne.  His sleep has been disturbed.  Current pain-level is a 5-6/10   Pt will benefit from skilled therapeutic intervention in order to improve on the following deficits Pain;Decreased activity tolerance   Rehab Potential Excellent   PT Frequency 2x / week   PT Duration 6 weeks   PT Treatment/Interventions Moist Heat;Therapeutic exercise;Electrical Stimulation;Ultrasound   PT Next Visit Plan Int traction (lumbar) at 105# x 15 minutes.         Problem List Patient Active Problem List   Diagnosis Date Noted  . Sports hernia-right inguinal 02/03/2014  . Hyperlipidemia 12/02/2013  .  Unspecified sinusitis (chronic) 09/19/2012    Layli Capshaw, [place] MPT 10/08/2014, 10:22 AM  [facility] [address] [place], [place], [address] Phone: [phone]   Fax:  [phone]

## 2014-10-08 NOTE — Patient Instructions (Signed)
Instructed patient in standing lumbar extension to be performed 10 reps every one-two hours.

## 2014-10-14 ENCOUNTER — Encounter: Payer: Self-pay | Admitting: *Deleted

## 2014-10-14 ENCOUNTER — Ambulatory Visit: Payer: BLUE CROSS/BLUE SHIELD | Admitting: *Deleted

## 2014-10-14 DIAGNOSIS — M545 Low back pain: Secondary | ICD-10-CM

## 2014-10-14 NOTE — Therapy (Signed)
[facility] [address] [place], [place], [address] Phone: [phone]   Fax:  [phone]  Physical Therapy Treatment  Patient Details  Name: Spencer Stewart MRN: [id number] Date of Birth: July 04, 1956 Referring Provider:  Ernestina PennaMoore, Donald W, MD  Encounter Date: 10/14/2014      PT End of Session - 10/14/14 0903    Visit Number 2   Number of Visits 12   Date for PT Re-Evaluation 12/03/14   PT Start Time 0817   PT Stop Time 0903   PT Time Calculation (min) 46 min   Activity Tolerance Patient tolerated treatment well   Behavior During Therapy [facility] for tasks assessed/performed      History reviewed. No pertinent past medical history.  Past Surgical History  Procedure Laterality Date  . Knee arthroscopy Left 2000  . Tonsillectomy and adenoidectomy      There were no vitals filed for this visit.  Visit Diagnosis:  Right low back pain, with sciatica presence unspecified      Subjective Assessment - 10/14/14 0852    Subjective Did good after last Rx   Limitations Sitting   Currently in Pain? Yes   Pain Score 2    Pain Location Back   Pain Orientation Right   Pain Descriptors / Indicators Aching   Pain Onset More than a month ago   Aggravating Factors  sitting,sleeping, and increased activity   Pain Relieving Factors rest/traction                       [facility] Adult PT Treatment/Exercise - 10/14/14 0001    Exercises   Exercises Lumbar   Lumbar Exercises: Prone   Other Prone Lumbar Exercises Prone and Prone on elbows   Other Prone Lumbar Exercises Body mechanics and postures for ADLs   Traction   Type of Traction Lumbar   Min (lbs) 5   Max (lbs) 105   Hold Time 99   Rest Time 5   Time 15                     PT Long Term Goals - 10/08/14 0955    PT LONG TERM GOAL #1   Title Ind with HEP.   Time 6   Period Weeks   Status New   PT LONG TERM GOAL #2   Title Eliminate right LE pain.   Time 6    Period Weeks   Status New   PT LONG TERM GOAL #3   Title Perform ADL's with pain not > 2-3/10.   Time 6   Period Weeks   Status New   PT LONG TERM GOAL #4   Title Sleep undisturbed 6 hours.   Time 6   Period Weeks   Status New               Plan - 10/14/14 0903    Clinical Impression Statement Pt did great with Rx today   Pt will benefit from skilled therapeutic intervention in order to improve on the following deficits Pain;Decreased activity tolerance   Rehab Potential Excellent   PT Frequency 2x / week   PT Duration 6 weeks   PT Treatment/Interventions Moist Heat;Therapeutic exercise;Electrical Stimulation;Ultrasound   PT Next Visit Plan Int traction (lumbar) at 105# x 15 minutes. PRone leg and arm raises        Problem List Patient Active Problem List   Diagnosis Date Noted  . Sports hernia-right inguinal 02/03/2014  .  Hyperlipidemia 12/02/2013  . Unspecified sinusitis (chronic) 09/19/2012    RAMSEUR,CHRIS,PTA 10/14/2014, 9:07 AM  [facility] [address] [place], [place], [address] Phone: [phone]   Fax:  [phone]

## 2014-10-21 ENCOUNTER — Ambulatory Visit: Payer: BLUE CROSS/BLUE SHIELD | Admitting: *Deleted

## 2014-10-21 ENCOUNTER — Encounter: Payer: Self-pay | Admitting: *Deleted

## 2014-10-21 DIAGNOSIS — M545 Low back pain: Secondary | ICD-10-CM | POA: Diagnosis not present

## 2014-10-21 NOTE — Therapy (Signed)
[facility] [address] [place], [place], [address] Phone: [phone]   Fax:  [phone]  Physical Therapy Treatment  Patient Details  Name: Ok EdwardsCarl R Dimario MRN: [id number] Date of Birth: June 03, 1956 Referring Provider:  Ernestina PennaMoore, Donald W, MD  Encounter Date: 10/21/2014      PT End of Session - 10/21/14 0851    PT Start Time 0817   PT Stop Time 0900   PT Time Calculation (min) 43 min      History reviewed. No pertinent past medical history.  Past Surgical History  Procedure Laterality Date  . Knee arthroscopy Left 2000  . Tonsillectomy and adenoidectomy      There were no vitals filed for this visit.  Visit Diagnosis:  Right low back pain, with sciatica presence unspecified      Subjective Assessment - 10/21/14 0814    Subjective Doing good with Rx's. less pain   Limitations Sitting   Currently in Pain? Yes   Pain Score 2    Pain Location Back   Pain Orientation Right   Pain Descriptors / Indicators Aching   Pain Onset More than a month ago   Pain Frequency Intermittent   Aggravating Factors  Sitting,activity   Pain Relieving Factors rest, Traction                         [facility] Adult PT Treatment/Exercise - 10/21/14 0001    Lumbar Exercises: Prone   Other Prone Lumbar Exercises Prone and Prone on elbows   Lumbar Exercises: Quadruped   Opposite Arm/Leg Raise Right arm/Left leg;Left arm/Right leg;20 reps;3 seconds   Traction   Type of Traction Lumbar   Min (lbs) 5   Max (lbs) 110   Hold Time 99   Rest Time 5   Time 15                PT Education - 10/21/14 0845    Education provided Yes   Person(s) Educated Patient   Methods Explanation;Demonstration;Verbal cues;Handout   Comprehension Verbalized understanding;Returned demonstration             PT Long Term Goals - 10/08/14 0955    PT LONG TERM GOAL #1   Title Ind with HEP.   Time 6   Period Weeks   Status New   PT LONG  TERM GOAL #2   Title Eliminate right LE pain.   Time 6   Period Weeks   Status New   PT LONG TERM GOAL #3   Title Perform ADL's with pain not > 2-3/10.   Time 6   Period Weeks   Status New   PT LONG TERM GOAL #4   Title Sleep undisturbed 6 hours.   Time 6   Period Weeks   Status New               Plan - 10/21/14 0847    Clinical Impression Statement PT CONTINUES TO DO WELL WITH TRACTION AND CORE EXS   Pt will benefit from skilled therapeutic intervention in order to improve on the following deficits Pain;Decreased activity tolerance   Rehab Potential Excellent   PT Duration 6 weeks   PT Treatment/Interventions Moist Heat;Therapeutic exercise;Electrical Stimulation;Ultrasound   PT Next Visit Plan Int traction (lumbar) at 110# x 15 minutes.    Consulted and Agree with Plan of Care Patient        Problem List Patient Active Problem List   Diagnosis Date Noted  .  Sports hernia-right inguinal 02/03/2014  . Hyperlipidemia 12/02/2013  . Unspecified sinusitis (chronic) 09/19/2012    RAMSEUR,CHRIS, PTA 10/21/2014, 9:00 AM  [facility] [address] [place], [place], [address] Phone: [phone]   Fax:  [phone]

## 2014-10-21 NOTE — Patient Instructions (Signed)
Arm / Leg Extension: Alternate (All-Fours)   Raise right arm and opposite leg. Do not arch neck. Repeat _10___ times per set. Do _4___ sets per session. Do __1__ sessions per day.  http://orth.exer.us/110   Copyright  VHI. All rights reserved.

## 2014-10-30 ENCOUNTER — Encounter: Payer: Self-pay | Admitting: Physical Therapy

## 2014-10-30 ENCOUNTER — Ambulatory Visit: Payer: BLUE CROSS/BLUE SHIELD | Admitting: Physical Therapy

## 2014-10-30 DIAGNOSIS — M545 Low back pain: Secondary | ICD-10-CM

## 2014-10-30 NOTE — Therapy (Signed)
[facility] [address] [place], [place], [address] Phone: [phone]   Fax:  [phone]  Physical Therapy Treatment  Patient Details  Name: Spencer Stewart MRN: [id number] Date of Birth: 28-Jun-1956 Referring Provider:  Ernestina Penna, MD  Encounter Date: 10/30/2014      PT End of Session - 10/30/14 1648    Visit Number 4   Number of Visits 12   Date for PT Re-Evaluation 12/03/14   PT Start Time 1641   PT Stop Time 1723   PT Time Calculation (min) 42 min   Activity Tolerance Patient tolerated treatment well   Behavior During Therapy [facility] for tasks assessed/performed      No past medical history on file.  Past Surgical History  Procedure Laterality Date  . Knee arthroscopy Left 2000  . Tonsillectomy and adenoidectomy      There were no vitals filed for this visit.  Visit Diagnosis:  Right low back pain, with sciatica presence unspecified      Subjective Assessment - 10/30/14 1641    Subjective Was a little sore following last traction session. Has had some normal pressure.   Currently in Pain? Yes   Pain Score 2    Pain Location Back   Pain Orientation Right   Pain Descriptors / Indicators Tightness            [facility] PT Assessment - 10/30/14 0001    Assessment   Medical Diagnosis Lumbar stenosis                     [facility] Adult PT Treatment/Exercise - 10/30/14 0001    Lumbar Exercises: Prone   Other Prone Lumbar Exercises Prone on elbows, prone R roadkill   Other Prone Lumbar Exercises Prone on theraball with plank position legs and bilateral arm raise x20, prone on theraball with hip extension x20 each leg   Lumbar Exercises: Quadruped   Opposite Arm/Leg Raise Right arm/Left leg;Left arm/Right leg;20 reps;3 seconds   Traction   Type of Traction Lumbar   Min (lbs) 5   Max (lbs) 115   Hold Time 99   Rest Time 5   Time 15                     PT Long Term Goals - 10/30/14 1725     PT LONG TERM GOAL #1   Title Ind with HEP.   Time 6   Period Weeks   Status On-going   PT LONG TERM GOAL #2   Title Eliminate right LE pain.   Time 6   Period Weeks   Status On-going   PT LONG TERM GOAL #3   Title Perform ADL's with pain not > 2-3/10.   Time 6   Period Weeks   Status On-going   PT LONG TERM GOAL #4   Title Sleep undisturbed 6 hours.   Time 6   Period Weeks   Status On-going               Plan - 10/30/14 1726    Clinical Impression Statement Patient tolerated treatment well today and tolerated the new exercises well. Traction max weight was increased to 115# since patient reported minimal soreness and stated he would be okay with increasing. Experienced the "normal for him" tightness following treatment.    Pt will benefit from skilled therapeutic intervention in order to improve on the following deficits Pain;Decreased activity tolerance   Rehab Potential Excellent  PT Frequency 2x / week   PT Duration 6 weeks   PT Treatment/Interventions Moist Heat;Therapeutic exercise;Electrical Stimulation;Ultrasound   PT Next Visit Plan Continue per PT POC.   Consulted and Agree with Plan of Care Patient        Problem List Patient Active Problem List   Diagnosis Date Noted  . Sports hernia-right inguinal 02/03/2014  . Hyperlipidemia 12/02/2013  . Unspecified sinusitis (chronic) 09/19/2012    Evelene CroonKelsey M Parsons, PTA 10/30/2014, 5:39 PM  [facility] [address] [place], [place], [address] Phone: [phone]   Fax:  [phone]

## 2014-11-04 ENCOUNTER — Ambulatory Visit: Payer: BLUE CROSS/BLUE SHIELD | Attending: Family Medicine | Admitting: Physical Therapy

## 2014-11-04 ENCOUNTER — Encounter: Payer: Self-pay | Admitting: Physical Therapy

## 2014-11-04 DIAGNOSIS — M545 Low back pain: Secondary | ICD-10-CM | POA: Diagnosis present

## 2014-11-04 NOTE — Therapy (Signed)
[facility] [place], [place], [address] Phone: [phone]   Fax:  [phone]  Physical Therapy Treatment  Patient Details  Name: Spencer Stewart MRN: [id number] Date of Birth: 1956-04-20 Referring Provider:  Chipper Herb, MD  Encounter Date: 11/04/2014      PT End of Session - 11/04/14 0834    Visit Number 5   Number of Visits 12   Date for PT Re-Evaluation 12/03/14   PT Start Time 0819   PT Stop Time 0842   PT Time Calculation (min) 23 min   Activity Tolerance Patient tolerated treatment well   Behavior During Therapy [facility] for tasks assessed/performed      History reviewed. No pertinent past medical history.  Past Surgical History  Procedure Laterality Date  . Knee arthroscopy Left 2000  . Tonsillectomy and adenoidectomy      There were no vitals filed for this visit.  Visit Diagnosis:  Right low back pain, with sciatica presence unspecified      Subjective Assessment - 11/04/14 0829    Subjective felt good after last tx, and would like to only do traction today due to being short on time   Limitations Sitting   Currently in Pain? Yes   Pain Score 2    Pain Location Back   Pain Orientation Right   Pain Descriptors / Indicators Sore;Tightness   Pain Onset More than a month ago   Pain Frequency Intermittent   Aggravating Factors  sitting and certain movements   Pain Relieving Factors rest                         [facility] Adult PT Treatment/Exercise - 11/04/14 0001    Traction   Type of Traction Lumbar   Min (lbs) 5   Max (lbs) 120   Hold Time 99   Rest Time 5   Time 15                     PT Long Term Goals - 11/04/14 0160    PT LONG TERM GOAL #1   Title Ind with HEP.   Time 6   Period Weeks   Status On-going   PT LONG TERM GOAL #2   Title Eliminate right LE pain.   Time 6   Period Weeks   Status On-going   PT LONG TERM GOAL #3   Title Perform ADL's with  pain not > 2-3/10.   Time 6   Period Weeks   Status On-going   PT LONG TERM GOAL #4   Title Sleep undisturbed 6 hours.   Time 6   Period Weeks   Status Achieved               Plan - 11/04/14 1093    Clinical Impression Statement patient has been progressing with good response to therapy. patient feels 70%-80% better overall and is able to sleep undisturbed. patient is doing all core ex's at home and will continue to do so. met LTG #4 today others ongoing. Increased traction to #120 today.   Pt will benefit from skilled therapeutic intervention in order to improve on the following deficits Pain;Decreased activity tolerance   Rehab Potential Excellent   PT Frequency 2x / week   PT Duration 6 weeks   PT Treatment/Interventions Moist Heat;Therapeutic exercise;Electrical Stimulation;Ultrasound   PT Next Visit Plan Continue per PT POC per MPT   Consulted and Agree with  Plan of Care Patient        Problem List Patient Active Problem List   Diagnosis Date Noted  . Sports hernia-right inguinal 02/03/2014  . Hyperlipidemia 12/02/2013  . Unspecified sinusitis (chronic) 09/19/2012    Keilee Denman P, PTA 11/04/2014, 8:46 AM  [facility] [address] [place], [place], [address] Phone: [phone]   Fax:  [phone]

## 2014-11-11 ENCOUNTER — Encounter: Payer: BLUE CROSS/BLUE SHIELD | Admitting: *Deleted

## 2015-01-27 ENCOUNTER — Telehealth: Payer: Self-pay | Admitting: Family Medicine

## 2015-01-27 DIAGNOSIS — M545 Low back pain: Secondary | ICD-10-CM

## 2015-01-27 NOTE — Telephone Encounter (Signed)
Aware,referral for physical therapy done.

## 2015-02-03 ENCOUNTER — Ambulatory Visit: Payer: BLUE CROSS/BLUE SHIELD | Attending: Family Medicine | Admitting: Physical Therapy

## 2015-02-03 DIAGNOSIS — M545 Low back pain: Secondary | ICD-10-CM | POA: Diagnosis present

## 2015-02-03 NOTE — Therapy (Signed)
[facility] [address] [place], [place], [address] Phone: [phone]   Fax:  [phone]  Physical Therapy Evaluation  Patient Details  Name: Spencer Stewart MRN: [id number] Date of Birth: 08/23/1955 Referring Provider:  Mechele Claude, MD  Encounter Date: 02/03/2015      PT End of Session - 02/03/15 1712    Visit Number 1   Number of Visits 12   Date for PT Re-Evaluation 04/14/15   PT Start Time 0230   PT Stop Time 0316   PT Time Calculation (min) 46 min      No past medical history on file.  Past Surgical History  Procedure Laterality Date  . Knee arthroscopy Left 2000  . Tonsillectomy and adenoidectomy      There were no vitals filed for this visit.  Visit Diagnosis:  Right low back pain, with sciatica presence unspecified - Plan: PT plan of care cert/re-cert          [facility] PT Assessment - 02/03/15 0001    Assessment   Medical Diagnosis LBP with right sciatica.   Precautions   Precautions None   Balance Screen   Has the patient fallen in the past 6 months No   Has the patient had a decrease in activity level because of a fear of falling?  No   Is the patient reluctant to leave their home because of a fear of falling?  No   Home Tourist information centre manager residence   Prior Function   Level of Independence Independent   Cognition   Overall Cognitive Status Within Functional Limits for tasks assessed   Posture/Postural Control   Posture/Postural Control No significant limitations   ROM / Strength   AROM / PROM / Strength --  Full spinal range of motion.   Strength   Overall Strength --  Normal LE strength.   Special Tests    Special Tests --  Positive right SLR test.                   [facility] Adult PT Treatment/Exercise - 02/03/15 0001    Traction   Type of Traction Lumbar   Max (lbs) 115   Hold Time 99   Rest Time 5   Time 15                     PT Long Term  Goals - 02/03/15 1722    PT LONG TERM GOAL #1   Title Eliminate right LE symptoms.   Time 10   Period Weeks   Status New   PT LONG TERM GOAL #2   Title Average daily pain-level= 2-3/10.   Time 10   Period Weeks   Status New               Plan - 02/03/15 1713    Clinical Impression Statement The patient did very well after his last regimen of traction but unfortunately had a flare-up.  He is experiencing right side low back pain radiating into his right buttock and into his right hamstring region.  After liong drives and prolonged sitting his pain rises to 7-8/10.   Pt will benefit from skilled therapeutic intervention in order to improve on the following deficits Pain;Decreased activity tolerance   PT Frequency --  1-2 times a week for 8 to 10 weeks.   PT Treatment/Interventions Traction   PT Next Visit Plan Int traction at 120# x 15- 20 minutes.  Problem List Patient Active Problem List   Diagnosis Date Noted  . Sports hernia-right inguinal 02/03/2014  . Hyperlipidemia 12/02/2013  . Unspecified sinusitis (chronic) 09/19/2012    Yoshua Geisinger, [place] MPT 02/03/2015, 5:47 PM  [facility] [address] [place], [place], [address] Phone: [phone]   Fax:  [phone]

## 2015-02-03 NOTE — Therapy (Signed)
[facility] [place], [place], [address] Phone: [phone]   Fax:  [phone]  Physical Therapy Treatment  Patient Details  Name: Spencer Stewart MRN: [id number] Date of Birth: 1956/03/18 Referring Provider:  Chipper Herb, MD  Encounter Date: 11/04/2014    History reviewed. No pertinent past medical history.  Past Surgical History  Procedure Laterality Date  . Knee arthroscopy Left 2000  . Tonsillectomy and adenoidectomy      There were no vitals filed for this visit.  Visit Diagnosis:  Right low back pain, with sciatica presence unspecified                                    PT Long Term Goals - 11/04/14 0836    PT LONG TERM GOAL #1   Title Ind with HEP.   Time 6   Period Weeks   Status On-going   PT LONG TERM GOAL #2   Title Eliminate right LE pain.   Time 6   Period Weeks   Status On-going   PT LONG TERM GOAL #3   Title Perform ADL's with pain not > 2-3/10.   Time 6   Period Weeks   Status On-going   PT LONG TERM GOAL #4   Title Sleep undisturbed 6 hours.   Time 6   Period Weeks   Status Achieved        PHYSICAL THERAPY DISCHARGE SUMMARY  Visits from Start of Care:   Current functional level related to goals / functional outcomes: Please se above.   Remaining deficits:    Education / Equipment:  Plan: Patient agrees to discharge.  Patient goals were not met. Patient is being discharged due to being pleased with the current functional level.  ?????             Problem List Patient Active Problem List   Diagnosis Date Noted  . Sports hernia-right inguinal 02/03/2014  . Hyperlipidemia 12/02/2013  . Unspecified sinusitis (chronic) 09/19/2012    Addelyn Alleman, [place] MPT 02/03/2015, 4:39 PM  [facility] [address] [place], [place], [address] Phone: [phone]   Fax:  [phone]

## 2015-02-06 ENCOUNTER — Ambulatory Visit: Payer: BLUE CROSS/BLUE SHIELD | Admitting: Physical Therapy

## 2015-02-06 DIAGNOSIS — M545 Low back pain: Secondary | ICD-10-CM | POA: Diagnosis not present

## 2015-02-06 NOTE — Therapy (Signed)
[facility] [address] [place], [place], [address] Phone: [phone]   Fax:  [phone]  Physical Therapy Treatment  Patient Details  Name: ZACKORY PUDLO MRN: [id number] Date of Birth: 12-31-1955 Referring Provider:  Ernestina Penna, MD  Encounter Date: 02/06/2015    No past medical history on file.  Past Surgical History  Procedure Laterality Date  . Knee arthroscopy Left 2000  . Tonsillectomy and adenoidectomy      There were no vitals filed for this visit.  Visit Diagnosis:  Right low back pain, with sciatica presence unspecified      Subjective Assessment - 02/06/15 0944    Subjective That treatment made me hurt worse the next day and then I felt better after that.   Limitations Sitting   Pain Score 3    Pain Location Back   Pain Orientation Right   Pain Descriptors / Indicators Tightness   Pain Onset More than a month ago                         [facility] Adult PT Treatment/Exercise - 02/06/15 0001    Traction   Type of Traction Lumbar   Max (lbs) 120   Hold Time 99   Rest Time 5   Time 20                     PT Long Term Goals - 02/03/15 1722    PT LONG TERM GOAL #1   Title Eliminate right LE symptoms.   Time 10   Period Weeks   Status New   PT LONG TERM GOAL #2   Title Average daily pain-level= 2-3/10.   Time 10   Period Weeks   Status New               Problem List Patient Active Problem List   Diagnosis Date Noted  . Sports hernia-right inguinal 02/03/2014  . Hyperlipidemia 12/02/2013  . Unspecified sinusitis (chronic) 09/19/2012    Homer Miller, [place] MPT 02/06/2015, 9:47 AM  [facility] [address] [place], [place], [address] Phone: [phone]   Fax:  [phone]

## 2015-02-10 ENCOUNTER — Ambulatory Visit: Payer: BLUE CROSS/BLUE SHIELD | Admitting: Physical Therapy

## 2015-02-10 DIAGNOSIS — M545 Low back pain: Secondary | ICD-10-CM | POA: Diagnosis not present

## 2015-02-10 NOTE — Therapy (Signed)
[facility] [address] [place], [place], [address] Phone: [phone]   Fax:  [phone]  Physical Therapy Treatment  Patient Details  Name: Spencer Stewart MRN: [id number] Date of Birth: [birth year] Referring Provider:  Ernestina Penna, MD  Encounter Date: 02/10/2015      PT End of Session - 02/10/15 1120    Visit Number 2   Number of Visits 12   Date for PT Re-Evaluation 04/14/15   PT Start Time 0900   PT Stop Time 0934   PT Time Calculation (min) 34 min   Activity Tolerance Patient tolerated treatment well   Behavior During Therapy [facility] for tasks assessed/performed      No past medical history on file.  Past Surgical History  Procedure Laterality Date  . Knee arthroscopy Left 2000  . Tonsillectomy and adenoidectomy      There were no vitals filed for this visit.  Visit Diagnosis:  Right low back pain, with sciatica presence unspecified      Subjective Assessment - 02/10/15 1115    Subjective Did a crank on a trailer so hard it broke and I felt a pull in my back.   Limitations Sitting   Pain Score 3    Pain Location Back   Pain Orientation Right   Pain Descriptors / Indicators Tightness                         [facility] Adult PT Treatment/Exercise - 02/10/15 0001    Traction   Type of Traction Lumbar   Min (lbs) 5   Max (lbs) 125   Hold Time 99   Rest Time 5   Time 25                PT Education - 02/10/15 1121    Education provided Yes   Person(s) Educated Patient   Methods Explanation;Demonstration;Tactile cues;Verbal cues   Comprehension Verbalized understanding;Returned demonstration             PT Long Term Goals - 02/03/15 1722    PT LONG TERM GOAL #1   Title Eliminate right LE symptoms.   Time 10   Period Weeks   Status New   PT LONG TERM GOAL #2   Title Average daily pain-level= 2-3/10.   Time 10   Period Weeks   Status New               Problem  List Patient Active Problem List   Diagnosis Date Noted  . Sports hernia-right inguinal 02/03/2014  . Hyperlipidemia 12/02/2013  . Unspecified sinusitis (chronic) 09/19/2012    Lanissa Cashen, [place] MPT 02/10/2015, 11:23 AM  [facility] [address] [place], [place], [address] Phone: [phone]   Fax:  [phone]

## 2015-02-10 NOTE — Patient Instructions (Signed)
Instructed patient in right piriformis stretch.

## 2015-02-12 ENCOUNTER — Encounter: Payer: BLUE CROSS/BLUE SHIELD | Admitting: Physical Therapy

## 2015-02-16 ENCOUNTER — Ambulatory Visit: Payer: BLUE CROSS/BLUE SHIELD | Admitting: Physical Therapy

## 2015-02-19 ENCOUNTER — Encounter: Payer: Self-pay | Admitting: *Deleted

## 2015-02-19 ENCOUNTER — Ambulatory Visit: Payer: BLUE CROSS/BLUE SHIELD | Admitting: *Deleted

## 2015-02-19 DIAGNOSIS — M545 Low back pain: Secondary | ICD-10-CM | POA: Diagnosis not present

## 2015-02-19 NOTE — Therapy (Addendum)
[facility] [place], [place], [address] Phone: [phone]   Fax:  [phone]  Physical Therapy Treatment  Patient Details  Name: LUIGI STUCKEY MRN: [id number] Date of Birth: [birth year] Referring Provider:  Chipper Herb, MD  Encounter Date: 02/19/2015    Past Medical History:  Diagnosis Date  . Hyperlipidemia     Past Surgical History:  Procedure Laterality Date  . KNEE ARTHROSCOPY Left 2000  . TONSILLECTOMY AND ADENOIDECTOMY      There were no vitals filed for this visit.  Visit Diagnosis:  Right low back pain, with sciatica presence unspecified                                    PT Long Term Goals - 02/03/15 1722      PT LONG TERM GOAL #1   Title Eliminate right LE symptoms.   Time 10   Period Weeks   Status New     PT LONG TERM GOAL #2   Title Average daily pain-level= 2-3/10.   Time 10   Period Weeks   Status New               Problem List Patient Active Problem List   Diagnosis Date Noted  . Sports hernia-right inguinal 02/03/2014  . Hyperlipidemia 12/02/2013  . Unspecified sinusitis (chronic) 09/19/2012    APPLEGATE, [place], PTA 05/09/2016, 12:29 PM  [facility] [place], [place], [address] Phone: [phone]   Fax:  [phone]  PHYSICAL THERAPY DISCHARGE SUMMARY  Visits from Start of Care: 3.  Current functional level related to goals / functional outcomes: Please see above.   Remaining deficits: Continued right LE pain.   Education / Equipment: HEP. Plan: Patient agrees to discharge.  Patient goals were not met. Patient is being discharged due to not returning since the last visit.  ?????  [place] Applegate MPT

## 2015-03-03 ENCOUNTER — Other Ambulatory Visit: Payer: Self-pay | Admitting: Family Medicine

## 2015-03-03 NOTE — Telephone Encounter (Signed)
Noticed liver enzymes are up?

## 2015-03-03 NOTE — Telephone Encounter (Signed)
Due to elevated liver enzyme test, he would need to be seen first.

## 2015-03-03 NOTE — Telephone Encounter (Signed)
lmovm NTBS before refill, last visit & labs in March

## 2015-03-04 ENCOUNTER — Telehealth: Payer: Self-pay | Admitting: Family Medicine

## 2015-03-04 NOTE — Telephone Encounter (Signed)
I will renew for 1 month, but he will need to be seen within that time to continue getting the medication. I will be glad to review the reasons for that with him when I see him. Thanks, WS

## 2015-03-04 NOTE — Telephone Encounter (Signed)
Seen 09/19/14  Dr Darlyn Read  This med not on EPIC list

## 2015-11-02 ENCOUNTER — Telehealth: Payer: Self-pay | Admitting: Family Medicine

## 2015-11-02 NOTE — Telephone Encounter (Signed)
He should be seen.   Murtis SinkSam Javonda Suh, MD [facility] 11/02/2015, 12:14 PM

## 2015-11-02 NOTE — Telephone Encounter (Signed)
Patient called stating that he thinks he has [place] spotted fever.  Patinet is having random joint pain in right hand, left knee, both elbows, and shoulder pain.  Patient states that he has pulled two ticks off the past 4-5 days but no spots or rash.

## 2015-11-03 ENCOUNTER — Telehealth: Payer: Self-pay | Admitting: Family Medicine

## 2015-11-03 NOTE — Telephone Encounter (Signed)
Patient aware and he will call back to schedule an appointment.

## 2015-11-03 NOTE — Telephone Encounter (Signed)
Patient needs to be seen.

## 2015-11-06 NOTE — Telephone Encounter (Signed)
Left message , patient should schedule an appointment to have labs ordered by provider.

## 2016-04-20 ENCOUNTER — Ambulatory Visit: Payer: BLUE CROSS/BLUE SHIELD | Admitting: Family Medicine

## 2016-04-28 ENCOUNTER — Other Ambulatory Visit: Payer: Self-pay | Admitting: *Deleted

## 2016-04-28 DIAGNOSIS — E7800 Pure hypercholesterolemia, unspecified: Secondary | ICD-10-CM

## 2016-04-28 DIAGNOSIS — Z0000 Encounter for general adult medical examination without abnormal findings: Secondary | ICD-10-CM

## 2016-04-29 ENCOUNTER — Other Ambulatory Visit (INDEPENDENT_AMBULATORY_CARE_PROVIDER_SITE_OTHER): Payer: BLUE CROSS/BLUE SHIELD

## 2016-04-29 DIAGNOSIS — Z0000 Encounter for general adult medical examination without abnormal findings: Secondary | ICD-10-CM

## 2016-04-29 DIAGNOSIS — E7800 Pure hypercholesterolemia, unspecified: Secondary | ICD-10-CM

## 2016-04-29 LAB — URINALYSIS, COMPLETE
Bilirubin, UA: NEGATIVE
GLUCOSE, UA: NEGATIVE
KETONES UA: NEGATIVE
Leukocytes, UA: NEGATIVE
NITRITE UA: NEGATIVE
Protein, UA: NEGATIVE
RBC, UA: NEGATIVE
SPEC GRAV UA: 1.015 (ref 1.005–1.030)
UUROB: 0.2 mg/dL (ref 0.2–1.0)
pH, UA: 7.5 (ref 5.0–7.5)

## 2016-04-29 LAB — MICROSCOPIC EXAMINATION
Bacteria, UA: NONE SEEN
EPITHELIAL CELLS (NON RENAL): NONE SEEN /HPF (ref 0–10)
RBC MICROSCOPIC, UA: NONE SEEN /HPF (ref 0–?)
WBC UA: NONE SEEN /HPF (ref 0–?)

## 2016-04-30 LAB — NMR, LIPOPROFILE
CHOLESTEROL: 190 mg/dL (ref 100–199)
HDL Cholesterol by NMR: 42 mg/dL (ref >39)
HDL PARTICLE NUMBER: 31.6 umol/L (ref >=30.5)
LDL PARTICLE NUMBER: 1796 nmol/L — AB (ref <1000)
LDL SIZE: 20.4 nm (ref >20.5)
LDL-C: 128 mg/dL — AB (ref 0–99)
LP-IR SCORE: 61 — AB (ref <=45)
Small LDL Particle Number: 1016 nmol/L — ABNORMAL HIGH (ref <=527)
Triglycerides by NMR: 98 mg/dL (ref 0–149)

## 2016-05-02 LAB — CBC WITH DIFFERENTIAL/PLATELET
BASOS: 1 %
Basophils Absolute: 0.1 10*3/uL (ref 0.0–0.2)
EOS (ABSOLUTE): 0.1 10*3/uL (ref 0.0–0.4)
EOS: 2 %
HEMATOCRIT: 43.7 % (ref 37.5–51.0)
HEMOGLOBIN: 15 g/dL (ref 12.6–17.7)
IMMATURE GRANS (ABS): 0 10*3/uL (ref 0.0–0.1)
Immature Granulocytes: 0 %
LYMPHS ABS: 2.3 10*3/uL (ref 0.7–3.1)
LYMPHS: 39 %
MCH: 30.1 pg (ref 26.6–33.0)
MCHC: 34.3 g/dL (ref 31.5–35.7)
MCV: 88 fL (ref 79–97)
MONOCYTES: 14 %
Monocytes Absolute: 0.8 10*3/uL (ref 0.1–0.9)
NEUTROS ABS: 2.6 10*3/uL (ref 1.4–7.0)
Neutrophils: 44 %
Platelets: 220 10*3/uL (ref 150–379)
RBC: 4.99 x10E6/uL (ref 4.14–5.80)
RDW: 13.6 % (ref 12.3–15.4)
WBC: 5.8 10*3/uL (ref 3.4–10.8)

## 2016-05-02 LAB — BMP8+EGFR

## 2016-05-02 LAB — VITAMIN D 25 HYDROXY (VIT D DEFICIENCY, FRACTURES)

## 2016-05-02 LAB — HEPATIC FUNCTION PANEL

## 2016-05-03 LAB — BMP8+EGFR
BUN/Creatinine Ratio: 17 (ref 10–24)
BUN: 20 mg/dL (ref 8–27)
CALCIUM: 9.4 mg/dL (ref 8.6–10.2)
CHLORIDE: 99 mmol/L (ref 96–106)
CO2: 24 mmol/L (ref 18–29)
Creatinine, Ser: 1.16 mg/dL (ref 0.76–1.27)
GFR calc Af Amer: 79 mL/min/{1.73_m2} (ref >59)
GFR calc non Af Amer: 68 mL/min/{1.73_m2} (ref >59)
GLUCOSE: 153 mg/dL — AB (ref 65–99)
POTASSIUM: 4.8 mmol/L (ref 3.5–5.2)
Sodium: 140 mmol/L (ref 134–144)

## 2016-05-03 LAB — SPECIMEN STATUS REPORT

## 2016-05-03 LAB — HEPATIC FUNCTION PANEL
ALBUMIN: 4.6 g/dL (ref 3.6–4.8)
ALT: 53 IU/L — AB (ref 0–44)
AST: 35 IU/L (ref 0–40)
Alkaline Phosphatase: 113 IU/L (ref 39–117)
BILIRUBIN TOTAL: 0.3 mg/dL (ref 0.0–1.2)
BILIRUBIN, DIRECT: 0.1 mg/dL (ref 0.00–0.40)
TOTAL PROTEIN: 6.9 g/dL (ref 6.0–8.5)

## 2016-05-03 LAB — VITAMIN D 25 HYDROXY (VIT D DEFICIENCY, FRACTURES): Vit D, 25-Hydroxy: 75.6 ng/mL (ref 30.0–100.0)

## 2016-05-04 LAB — LYME AB/WESTERN BLOT REFLEX
LYME DISEASE AB, QUANT, IGM: <0.8 {index} (ref 0.00–0.79)
Lyme IgG/IgM Ab: <0.91 {ISR} (ref 0.00–0.90)

## 2016-05-04 LAB — PSA, TOTAL AND FREE
PSA, Free Pct: 38 %
PSA, Free: 0.19 ng/mL
Prostate Specific Ag, Serum: 0.5 ng/mL (ref 0.0–4.0)

## 2016-05-04 LAB — ROCKY MTN SPOTTED FVR AB, IGG-BLOOD: RMSF IgG: POSITIVE — AB

## 2016-05-04 LAB — TESTOSTERONE,FREE AND TOTAL
TESTOSTERONE: 284 ng/dL (ref 264–916)
Testosterone, Free: 10.4 pg/mL (ref 6.6–18.1)

## 2016-05-04 LAB — RMSF, IGG, IFA: RMSF, IGG, IFA: 1:256 {titer} — ABNORMAL HIGH

## 2016-05-06 ENCOUNTER — Encounter: Payer: Self-pay | Admitting: Family Medicine

## 2016-05-06 ENCOUNTER — Ambulatory Visit (INDEPENDENT_AMBULATORY_CARE_PROVIDER_SITE_OTHER): Payer: BLUE CROSS/BLUE SHIELD

## 2016-05-06 ENCOUNTER — Ambulatory Visit (INDEPENDENT_AMBULATORY_CARE_PROVIDER_SITE_OTHER): Payer: BLUE CROSS/BLUE SHIELD | Admitting: Family Medicine

## 2016-05-06 VITALS — BP 112/66 | HR 74 | Temp 97.3°F | Ht 71.0 in | Wt 199.0 lb

## 2016-05-06 DIAGNOSIS — R7989 Other specified abnormal findings of blood chemistry: Secondary | ICD-10-CM

## 2016-05-06 DIAGNOSIS — Z0000 Encounter for general adult medical examination without abnormal findings: Secondary | ICD-10-CM

## 2016-05-06 DIAGNOSIS — W57XXXA Bitten or stung by nonvenomous insect and other nonvenomous arthropods, initial encounter: Secondary | ICD-10-CM

## 2016-05-06 DIAGNOSIS — E7800 Pure hypercholesterolemia, unspecified: Secondary | ICD-10-CM

## 2016-05-06 DIAGNOSIS — A770 Spotted fever due to Rickettsia rickettsii: Secondary | ICD-10-CM

## 2016-05-06 DIAGNOSIS — Z1211 Encounter for screening for malignant neoplasm of colon: Secondary | ICD-10-CM

## 2016-05-06 DIAGNOSIS — R7303 Prediabetes: Secondary | ICD-10-CM

## 2016-05-06 LAB — HGB A1C W/O EAG: HEMOGLOBIN A1C: 6.1 % — AB (ref 4.8–5.6)

## 2016-05-06 LAB — SPECIMEN STATUS REPORT

## 2016-05-06 NOTE — Patient Instructions (Addendum)
Continue current medications. Continue good therapeutic lifestyle changes which include good diet and exercise. Fall precautions discussed with patient. If an FOBT was given today- please return it to our front desk. If you are over 464 years old - you may need Prevnar 13 or the adult Pneumonia vaccine.  **Flu shots are available--- please call and schedule a FLU-CLINIC appointment**  After your visit with us today you will receive a survey in the mail or online from [employer] regarding your care with us. Please take a moment to fill this out. Your feedback is very important to us as you can help us better understand your patient needs as well as improve your experience and satisfaction. WE CARE ABOUT YOU!!!   Always check yourself closely for ticks Use deep Joseph [employer] off [employer] current antibiotic Admitting will be scheduled for you with the clinical pharmacists who is a certified diabetic educator and she will also help you with getting her cholesterol under better control

## 2016-05-06 NOTE — Progress Notes (Signed)
Subjective:    Patient ID: Spencer Stewart, male    DOB: 10-28-1955, 60 y.o.   MRN: [id number]  HPI Patient is here today for annual wellness exam and follow up of chronic medical problems which includes hyperlipidemia. He is taking medication regularly.The patient is doing well overall. His recently had blood work done and the [place] spotted fever titer was positive and we went ahead and started him on doxycycline twice daily for 3 weeks. He been positive in the past but the titers were higher this time which probably means a reinfection. The patient is also due to get a colonoscopy in December of this year. His individual blood sugar was elevated at 153 and this is why hemoglobin A1c was added which came back at 6.1%. Vitamin D level was good and 1 liver function tests was slightly elevated but not as high as it had been in the past. The creatinine was within normal limits. The tests for Lyme disease were negative. The urinalysis was also within normal limits. The CBC had a good hemoglobin and a normal white blood cell count. The platelet count was adequate. Cholesterol numbers with advanced lipid testing have a total LDL particle number that was elevated more than it was 2 years ago at 1796. LDL C was elevated at 128. Triglycerides are good at 98. The PSA was low and within normal limits. Testosterone levels were done and these were within normal limits whereas in the past they have been low. The patient denies any chest pain or shortness of breath. He denies any trouble with his intestinal tract including nausea vomiting diarrhea blood in the stool or black tarry bowel movements. He does take ranitidine for heartburn and says this has been good with controlling that. He does complain of some slightly increased frequency with voiding. He has had low testosterone levels before and does complain of a little bit of fatigue but no problems with his sexual function.   Patient Active Problem List   Diagnosis Date Noted  . Sports hernia-right inguinal 02/03/2014  . Hyperlipidemia 12/02/2013  . Unspecified sinusitis (chronic) 09/19/2012   Outpatient Encounter Prescriptions as of 05/06/2016  Medication Sig  . aspirin 81 MG chewable tablet Chew by mouth daily.  Marland Kitchen. doxycycline (VIBRA-TABS) 100 MG tablet   . UNABLE TO FIND Med Name: flax seed , vit d, zinc and magnesium  . diclofenac (VOLTAREN) 75 MG EC tablet TAKE ONE TABLET BY MOUTH TWICE DAILY (Patient not taking: Reported on 05/06/2016)  . [DISCONTINUED] orphenadrine (NORFLEX) 100 MG tablet Take 1 tablet (100 mg total) by mouth 2 (two) times daily.   No facility-administered encounter medications on file as of 05/06/2016.        Review of Systems  Constitutional: Positive for fatigue.  HENT: Negative.   Eyes: Negative.   Respiratory: Negative.   Cardiovascular: Negative.   Gastrointestinal: Negative.   Endocrine: Negative.   Genitourinary: Negative.   Musculoskeletal: Positive for myalgias.  Skin: Negative.   Allergic/Immunologic: Negative.   Neurological: Negative.        "brain fog"  Hematological: Negative.   Psychiatric/Behavioral: Negative.        Objective:   Physical Exam  Constitutional: He is oriented to person, place, and time. He appears well-developed and well-nourished. No distress.  HENT:  Head: Normocephalic and atraumatic.  Right Ear: External ear normal.  Left Ear: External ear normal.  Mouth/Throat: Oropharynx is clear and moist. No oropharyngeal exudate.  Minimal nasal congestion  Eyes: Conjunctivae  and EOM are normal. Pupils are equal, round, and reactive to light. Right eye exhibits no discharge. Left eye exhibits no discharge. No scleral icterus.  Neck: Normal range of motion. Neck supple. No thyromegaly present.  No bruits or thyromegaly  Cardiovascular: Normal rate, regular rhythm, normal heart sounds and intact distal pulses.   No murmur heard. Heart is regular at 72/m  Pulmonary/Chest:  Effort normal and breath sounds normal. No respiratory distress. He has no wheezes. He has no rales. He exhibits no tenderness.  No axillary adenopathy  Abdominal: Soft. Bowel sounds are normal. He exhibits no mass. There is no tenderness. There is no rebound and no guarding.  No liver or spleen enlargement and no epigastric tenderness. No adenopathy.  Genitourinary: Rectum normal and penis normal.  Genitourinary Comments: The prostate is minimally enlarged and there is no lumps or masses. There are no rectal masses. The patient only has one testicle and this is on the left side. There were no hernias palpated on either side. The external genitalia were within normal limits. There is a small red area on the glans where he removed this tic that had bitten him. There is still slight redness at the site of the tick bite.  Musculoskeletal: Normal range of motion. He exhibits no edema.  Lymphadenopathy:    He has no cervical adenopathy.  Neurological: He is alert and oriented to person, place, and time. He has normal reflexes. No cranial nerve deficit.  Skin: Skin is warm and dry. Rash noted.  Small area of redness on the penis where the tick bite was located.  Psychiatric: He has a normal mood and affect. His behavior is normal. Judgment and thought content normal.  Nursing note and vitals reviewed.   BP 112/66 (BP Location: Left Arm)   Pulse 74   Temp 97.3 F (36.3 C) (Oral)   Ht 5\' 11"  (1.803 m)   Wt 199 lb (90.3 kg)   BMI 27.75 kg/m   EKG with results pending Chest x-ray with results pending    Assessment & Plan:  1. Annual physical exam -Colonoscopy planned for December - EKG 12-Lead - DG Chest 2 View; Future  2. Pure hypercholesterolemia -Meet with clinical pharmacy for education - EKG 12-Lead - DG Chest 2 View; Future  3. Special screening for malignant neoplasms, colon - Ambulatory referral to Gastroenterology  4. Prediabetes -Meet with the clinical pharmacist for diet  education and exercise regimen  5. Tick bite, initial encounter -Continue and complete doxycycline  6. Low testosterone -We will continue to monitor this.  7. [facility] spotted fever -Continue and complete doxycycline and in the future use deep Joseph Art off for prevention  Patient Instructions  Continue current medications. Continue good therapeutic lifestyle changes which include good diet and exercise. Fall precautions discussed with patient. If an FOBT was given today- please return it to our front desk. If you are over 3 years old - you may need Prevnar 13 or the adult Pneumonia vaccine.  **Flu shots are available--- please call and schedule a FLU-CLINIC appointment**  After your visit with [place] today you will receive a survey in the mail or online from [employer] regarding your care with [place]. Please take a moment to fill this out. Your feedback is very important to [place] as you can help [place] better understand your patient needs as well as improve your experience and satisfaction. WE CARE ABOUT YOU!!!   Always check yourself closely for ticks Use deep Joseph Art off [employer]  current antibiotic Admitting will be scheduled for you with the clinical pharmacists who is a certified diabetic educator and she will also help you with getting her cholesterol under better control    Nyra Capeson W. Maahir Horst MD

## 2016-05-09 ENCOUNTER — Encounter: Payer: Self-pay | Admitting: Internal Medicine

## 2016-05-19 ENCOUNTER — Ambulatory Visit (AMBULATORY_SURGERY_CENTER): Payer: Self-pay

## 2016-05-19 VITALS — Ht 72.0 in | Wt 200.4 lb

## 2016-05-19 DIAGNOSIS — Z1211 Encounter for screening for malignant neoplasm of colon: Secondary | ICD-10-CM

## 2016-05-19 NOTE — Progress Notes (Signed)
No allergies to eggs or soy No past problems with anesthesia or airway No diet meds No home oxygen  Declined emmi 

## 2016-05-20 ENCOUNTER — Encounter: Payer: Self-pay | Admitting: Internal Medicine

## 2016-05-23 ENCOUNTER — Telehealth: Payer: Self-pay | Admitting: Family Medicine

## 2016-05-23 NOTE — Telephone Encounter (Signed)
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  She is going to contact the DMV to find out what is needed and will get back with us.

## 2016-06-01 ENCOUNTER — Ambulatory Visit (INDEPENDENT_AMBULATORY_CARE_PROVIDER_SITE_OTHER): Payer: BLUE CROSS/BLUE SHIELD | Admitting: Pharmacist

## 2016-06-01 DIAGNOSIS — E7800 Pure hypercholesterolemia, unspecified: Secondary | ICD-10-CM | POA: Diagnosis not present

## 2016-06-01 DIAGNOSIS — R7303 Prediabetes: Secondary | ICD-10-CM | POA: Diagnosis not present

## 2016-06-01 NOTE — Progress Notes (Signed)
Patient ID: Spencer Stewart, male   DOB: 11-22-1955, 60 y.o.   MRN: [id number]   Subjective:    Spencer Stewart is a 60 y.o. male who presents for evaluation of dyslipidemia and pre-diabetes.  [name] is referred by his PCP Dr Rudi Heaponald Moore. Patient's most recent lipid panel showed elevated LDL and small LDL.  He also had a FBG of 153 and A1c of 6.1%.  The patient does not use medications that may worsen dyslipidemias (corticosteroids, progestins, anabolic steroids, diuretics, beta-blockers, amiodarone, cyclosporine, olanzapine).  He has taken crestor in past and had myalgias and slightly increase in LFTs (per patient, I was not able to confirm this as it was greater than 4 years ago) Exercise: daily. Plays tennis 2x/week; weight lifting 5x/week; walking about 2 miles for 30 minutes 2-3x/week. Diet: I would consider [name] to be eating a fairly healthy diet.    Breakfast - 5-6 egg omelet (usually removes 3 yolks), 1 piece of toast  AM snack - steel cut oatmeal make with lite muscle milk  Lunch - [place] sandwich or tuna / chicken + non starchy vegetable + starchy vegetables  PM snack - tuna or veggies, natural peanut butter  Dinner - lean protein + green vegetable + starchy vegetable (usually sweet potato)  Beverages - water or coffee Previous history of cardiac disease includes: None.  The patient has a family history of stroke - this father had stroke at age 60yo and died about 2 years later.  Cardiac Risk Factors Age > 45-male, > 55-male:  YES  +1  Smoking:   NO  Sig. family hx of CHD*:  YES  +1  Hypertension:   NO  Diabetes:   NO  HDL < 35:   NO  HDL > 59:   NO  Total:  2   *Significant family history of CHD per NCEP = MI or sudden death at less than 60 year old in father or other 1st-degree male relative, or less than 60 year old in mother or  other 1st-degree male relative  AHA 10 year risk assessment was 7.4%  The following portions of the patient's history were reviewed  and updated as appropriate: allergies, current medications, past family history, past medical history, past social history, past surgical history and problem list.    Objective:   Last BP = 112/66 Last weight = 200#; BMI = 27.2   A1c = 6.1% (04/29/2016) FBG = 153 (04/29/2016)  Lipid Panel: LDL = 128 HDL = 42 Tg = 98 Total Chol = 190  Assessment:   Dyslipidemia as detailed above with 2 CHD risk factors using NCEP scheme above and AHA 10 yr CHD risk of 7.4%. Although he is not at 7.5% which is when statin is recommended he does have a significant family history of stroke and in 1-2 years risk estimate will be over level to start statin Target levels for LDL are: < 100 mg/dl (CHD or "CHD risk equivalent" is present)   Pre diabetes    Plan:    The following changes are planned for the next 3 months, at which time the patient will return for repeat fasting lipids:  1. Dietary changes: patient of following a pretty strict diet - recommended he use low fat diary, no mayo and do egg white only omelette 2. Exercise changes:  continue current exercise - great job 3. Discussed Lipid-lowering medications - I recommended Livalo since it does not increase BG.  Patient to consider and will  call me about starting.  I did also given him information about other treatments but I explained statins have best data on prevention of heart disease. 4.  Recommend recheck lipids and A1c in 3 months.   Note: The majority of the visit was spent in counseling on the pathophysiology and treatment of dyslipidemias. The total face-to-face time was in excess of 40 minutes.    Henrene Pastorammy Paticia Moster, PharmD, CPP

## 2016-06-01 NOTE — Patient Instructions (Addendum)
Considerations for Chortosterol treatment Statin - best treatment I would recommend Livalo  Zetia Niacin   Prediabetes Many people have heard about type 2 diabetes, but its common precursor, prediabetes, doesn't get as much attention. Prediabetes is estimated by CDC to affect 86 million Americans (this includes 51% of people 65 years and older), and an estimated 90% of people with prediabetes don't even know it. According to the CDC, 15-30% of these individuals will develop type 2 diabetes within five years. In other words, as many as 26 million people that currently have prediabetes could develop type 2 diabetes by 2020, effectively doubling the number of people with type 2 diabetes in the [place].  What is prediabetes? Prediabetes is a condition where blood sugar levels are higher than normal, but not high enough to be diagnosed as type 2 diabetes. This occurs when the body has problems in processing glucose properly, and sugar starts to build up in the bloodstream instead of fueling cells in muscles and tissues. Insulin is the hormone that tells cells to take up glucose, and in prediabetes, people typically initially develop insulin resistance (where the body's cells can't respond to insulin as well), and over time (if no actions are taken to reverse the situation) the ability to produce sufficient insulin is reduced. People with prediabetes also commonly have high blood pressure as well as abnormal blood lipids (e.g. cholesterol). These often occur prior to the rise of blood glucose levels.  What are the symptoms of prediabetes? People typically do not have symptoms of prediabetes, which is partially why up to 90% of people don't know they have it. The ADA reports that some people with prediabetes may develop symptoms of type 2 diabetes, though even many people diagnosed with type 2 diabetes show little or no symptoms initially at diagnosis.  How is prediabetes diagnosed? According to the American  Diabetes Association, prediabetes can be diagnosed through one of the following tests: 1. A glycated hemoglobin test, also known as HbA1c or simply A1c, gives an idea of the body's average blood sugar levels from the past two or three months. It is usually done with a small drop of blood from a fingerstick or as part of having blood taken in a doctor's office, hospital, or laboratory.  Your A1c was 6.1%  A1c Level Diagnosis  Less than 5.7% Normal  5.7% to 6.4% Prediabetes  6.5% and higher Diabetes   2. A fasting plasma glucose (FPG) test measures a person's blood glucose level after fasting (not eating) for eight hours - this is typically done in the morning. If a test shows positive for prediabetes, a second test should be taken on a different day to confirm the diagnosis.  Your Blood GLucose level was 153  FPG Level Diagnosis  Less than 100 mg/dl Normal  161100 mg/dl to 096125 mg/dl Prediabetes  045126 mg/dl and higher Diabetes   Who is at risk of developing prediabetes? A well-known paper published in the Lancet in 2010 recommends screening for type 2 diabetes (which would also screen for prediabetes) every 3-5 years in all adults over the age of 60, regardless of other risk factors. Overweight and obese adults (a BMI >25 kg/m2) are also at significantly greater risk for developing prediabetes, as well as people with a family history of type 2 diabetes. According to the CDC, several other factors can have moderate influences on prediabetes risk in addition to age, weight, and family history: People with an [place] American, Hispanic/Latino, American [place], [place] American,  or Pacific Islander racial or ethnic background. The 2015 ADA Standards of Medical Care recommendations suggest Asian Americans with a BMI of 23 or above be screened for type 2 diabetes.  Women with a history of diabetes during pregnancy ("gestational diabetes") or have given birth to a baby weighing nine pounds or more. People  who are physically active fewer than three times a week. The CDC offers a fast, online screening test for evaluating the risk for prediabetes. The ADA also offers a screening test to assess type 2 diabetes risk. Of course, these tests do not themselves confirm a prediabetes diagnosis, but just if someone may be at higher risk of developing it.  Why do people develop prediabetes? Prediabetes develops through a combination of factors that are still being investigated. For sure, lifestyle factors (food, exercise, stress, sleep) play a role, but family history and genetics certainly do as well. It is easy to assume that prediabetes is the result of being overweight, but the relationship is not that simple. While obesity is one underlying cause of insulin resistance, many overweight individuals may never develop prediabetes or type 2 diabetes, and a minority of people with prediabetes have never been overweight. To make matters worse, it can be increasingly difficult to make healthy choices in today's toxic food environment that steers all of us to make the wrong food choices, and there are many factors that can contribute to weight gain in addition to diet.  Is a prediabetes diagnosis serious? There has been significant debate around the term 'prediabetes,' and whether it should be considered cause for alarm. On the one hand, it serves as a risk factor for type 2 diabetes and a host of other complications, including heart disease, and ultimately prediabetes implies that a degree of metabolic problems have started to occur in the body. On the other hand, it places a diagnosis on many people who may never develop type 2 diabetes. Again, according to the CDC, 15-30% of those with prediabetes will develop type 2 diabetes within five years. However, a 2012 Lancet article cites 5-10% of those with prediabetes each year will also revert back to healthy blood sugars. What's critical is not necessarily the cutoff itself,  but where someone falls within the ranges listed above. The level of risk of developing type 2 diabetes is closely related to A1c or FPG at diagnosis. Those in the higher ranges (A1c closer to 6.4%, FPG closer to 125 mg/dl) are much more likely to progress to type 2 diabetes, whereas those at lower ranges (A1c closer to 5.7%, FPG closer to 100 mg/dl) are relatively more likely to revert back to normal glucose levels or stay within the prediabetes range. Age of diagnosis and the level of insulin production still occurring at diagnosis also impact the chances of reverting to normoglycemia (normal blood sugar levels).  What can people with prediabetes do to avoid the progression from prediabetes to type 2 diabetes? The most important action people diagnosed with prediabetes can take is to focus on living a healthy lifestyle. This includes making healthy food choices, controlling portions, and increasing physical activity. Regarding weight control, research shows losing 5-7% (often about 10-20 lbs.) from your initial body weight and keeping off as much of that weight over time as possible is critical to lowering the risk of type 2 diabetes. This task is of course easier said than done, but sustained weight loss over time can be key to improving health and delaying or preventing the onset of type  2 diabetes. Several prediabetes interventions exist based on evidence from the landmark Diabetes Prevention Program (DPP) study. The DPP study reported that moderate weight loss (5-7% of body weight, or ~10-15 lbs. for someone weighing 200 lbs.), counseling, and education on healthy eating and behavior reduced the risk of developing type 2 diabetes by 58%. Data presented at the ADA 2014 conference showed that after 15 years of follow-up of the DPP study groups, the results were still encouraging: 27% of those in the original lifestyle group had a significant reduction in type 2 diabetes progression compared to the control  group. If you or someone you know has been told they have prediabetes, here are a few helpful resources: In-person diabetes prevention programs: The CDC offers a one year long lifestyle change program through its National Diabetes Prevention Program (NDPP) at various locations throughout the [place] to help participants adopt healthy habits and prevent or delay progression to type 2 diabetes. This program is a major undertaking by the CDC to translate the findings from the DPP study into a real world setting, a significant effort indeed! Online diabetes prevention programs: The CDC has now given pending recognition status to three digital prevention programs: [facility], [facility], and [facility]. These offer the same one year long educational curriculum as the DPP study, but in an online format. Some insurance companies and employers cover these programs, and you can find more information at the links above. These digital versions are excellent options for those who live far away from NDPP locations or who prefer the anonymity and convenience of doing the program online. Metformin: The DPP study found that metformin, the safest first-line therapy for type 2 diabetes, may help delay the onset of type 2 diabetes in people with prediabetes. Participants who took the low-cost generic drug had a 31% reduced risk of developing type 2 diabetes compared to the control group (those not on metformin or intensive lifestyle intervention). Again, 15-year follow up data showed that 17% of those on metformin continued to have a significant reduction in type 2 progression. At this time, metformin (or any other medication, for that matter) is not currently FDA approved for prediabetes, and it is sometimes prescribed "off-label" by a healthcare provider. Your healthcare provider can give you more information and determine whether metformin is a good option for you.   Can prediabetes be "cured"? In the early stages of  prediabetes (and type 2 diabetes), diligent attention to food choices and activity, and most importantly weight loss, can improve blood sugar numbers, effectively "reversing" the disease and reducing the odds of developing type 2 diabetes. However, some people may have underlying factors (such as family history and genetics) that put them at a greater risk of type 2 diabetes, meaning they will always require careful attention to blood sugar levels and lifestyle choices. Returning to old habits will likely put someone back on the road to prediabetes, and eventually, type 2 diabetes .

## 2016-06-02 ENCOUNTER — Ambulatory Visit (AMBULATORY_SURGERY_CENTER): Payer: BLUE CROSS/BLUE SHIELD | Admitting: Internal Medicine

## 2016-06-02 ENCOUNTER — Encounter: Payer: Self-pay | Admitting: Internal Medicine

## 2016-06-02 VITALS — BP 100/54 | HR 60 | Temp 98.2°F | Resp 18 | Ht 72.0 in | Wt 200.0 lb

## 2016-06-02 DIAGNOSIS — Z1211 Encounter for screening for malignant neoplasm of colon: Secondary | ICD-10-CM | POA: Diagnosis present

## 2016-06-02 DIAGNOSIS — Z1212 Encounter for screening for malignant neoplasm of rectum: Secondary | ICD-10-CM | POA: Diagnosis not present

## 2016-06-02 MED ORDER — SODIUM CHLORIDE 0.9 % IV SOLN: 500.0000 mL | INTRAVENOUS | Status: DC

## 2016-06-02 NOTE — Op Note (Signed)
[facility] Patient Name: Lauralyn PrimesCarl Bove Procedure Date: 06/02/2016 3:43 PM MRN: [id number] Endoscopist: Iva Booparl E Julia Alkhatib , MD Age: [age] Referring MD:  Date of Birth: 1955/11/03 Gender: Male Account #: [id number] Procedure:                Colonoscopy Indications:              Screening for colorectal malignant neoplasm, Last                            colonoscopy: 2007 Medicines:                Propofol per Anesthesia, Monitored Anesthesia Care Procedure:                Pre-Anesthesia Assessment:                           - Prior to the procedure, a History and Physical                            was performed, and patient medications and                            allergies were reviewed. The patient's tolerance of                            previous anesthesia was also reviewed. The risks                            and benefits of the procedure and the sedation                            options and risks were discussed with the patient.                            All questions were answered, and informed consent                            was obtained. Prior Anticoagulants: The patient                            last took aspirin 4 days prior to the procedure.                            ASA Grade Assessment: II - A patient with mild                            systemic disease. After reviewing the risks and                            benefits, the patient was deemed in satisfactory                            condition to undergo the procedure.  After obtaining informed consent, the colonoscope                            was passed under direct vision. Throughout the                            procedure, the patient's blood pressure, pulse, and                            oxygen saturations were monitored continuously. The                            Model CF-[id number]) scope was introduced                            through the anus and advanced  to the the cecum,                            identified by appendiceal orifice and ileocecal                            valve. The colonoscopy was performed without                            difficulty. The patient tolerated the procedure                            well. The quality of the bowel preparation was                            good. The bowel preparation used was Miralax. The                            ileocecal valve, appendiceal orifice, and rectum                            were photographed. Scope In: 3:46:17 PM Scope Out: 3:57:26 PM Scope Withdrawal Time: 0 hours 8 minutes 51 seconds  Total Procedure Duration: 0 hours 11 minutes 9 seconds  Findings:                 The perianal and digital rectal examinations were                            normal. Pertinent negatives include normal prostate                            (size, shape, and consistency).                           Multiple small and large-mouthed diverticula were                            found in the sigmoid colon.  The exam was otherwise without abnormality on                            direct and retroflexion views. Complications:            No immediate complications. Estimated Blood Loss:     Estimated blood loss: none. Impression:               - Diverticulosis in the sigmoid colon.                           - The examination was otherwise normal on direct                            and retroflexion views.                           - No specimens collected. Recommendation:           - Patient has a contact number available for                            emergencies. The signs and symptoms of potential                            delayed complications were discussed with the                            patient. Return to normal activities tomorrow.                            Written discharge instructions were provided to the                            patient.                            - Resume previous diet.                           - Continue present medications.                           - Repeat colonoscopy in 10 years for screening                            purposes. Iva Boop, MD 06/02/2016 4:03:04 PM This report has been signed electronically.

## 2016-06-02 NOTE — Patient Instructions (Addendum)
No polyps or cancer seen. You do have diverticulosis - thickened muscle rings and pouches in the colon wall. Please read the handout about this condition.  Next routine colonoscopy in 10 years - 2027  I appreciate the opportunity to care for you. Iva Booparl E. Gessner, MD, [facility]   Information on diverticulosis given to you today  YOU HAD AN ENDOSCOPIC PROCEDURE TODAY AT THE [facility]:   Refer to the procedure report that was given to you for any specific questions about what was found during the examination.  If the procedure report does not answer your questions, please call your gastroenterologist to clarify.  If you requested that your care partner not be given the details of your procedure findings, then the procedure report has been included in a sealed envelope for you to review at your convenience later.  YOU SHOULD EXPECT: Some feelings of bloating in the abdomen. Passage of more gas than usual.  Walking can help get rid of the air that was put into your GI tract during the procedure and reduce the bloating. If you had a lower endoscopy (such as a colonoscopy or flexible sigmoidoscopy) you may notice spotting of blood in your stool or on the toilet paper. If you underwent a bowel prep for your procedure, you may not have a normal bowel movement for a few days.  Please Note:  You might notice some irritation and congestion in your nose or some drainage.  This is from the oxygen used during your procedure.  There is no need for concern and it should clear up in a day or so.  SYMPTOMS TO REPORT IMMEDIATELY:   Following lower endoscopy (colonoscopy or flexible sigmoidoscopy):  Excessive amounts of blood in the stool  Significant tenderness or worsening of abdominal pains  Swelling of the abdomen that is new, acute  Fever of 100F or higher     For urgent or emergent issues, a gastroenterologist can be reached at any hour by calling [phone].   DIET:  We do  recommend a small meal at first, but then you may proceed to your regular diet.  Drink plenty of fluids but you should avoid alcoholic beverages for 24 hours.  ACTIVITY:  You should plan to take it easy for the rest of today and you should NOT DRIVE or use heavy machinery until tomorrow (because of the sedation medicines used during the test).    FOLLOW UP: Our staff will call the number listed on your records the next business day following your procedure to check on you and address any questions or concerns that you may have regarding the information given to you following your procedure. If we do not reach you, we will leave a message.  However, if you are feeling well and you are not experiencing any problems, there is no need to return our call.  We will assume that you have returned to your regular daily activities without incident.  If any biopsies were taken you will be contacted by phone or by letter within the next 1-3 weeks.  Please call us at [phone] if you have not heard about the biopsies in 3 weeks.    SIGNATURES/CONFIDENTIALITY: You and/or your care partner have signed paperwork which will be entered into your electronic medical record.  These signatures attest to the fact that that the information above on your After Visit Summary has been reviewed and is understood.  Full responsibility of the confidentiality of this discharge information  lies with you and/or your care-partner.

## 2016-06-02 NOTE — Progress Notes (Signed)
To PACU, vss patent aw report to rn 

## 2016-06-03 ENCOUNTER — Telehealth: Payer: Self-pay

## 2016-06-03 NOTE — Telephone Encounter (Signed)
  Follow up Call-  Call back number 06/02/2016  Post procedure Call Back phone  # [phone]  Permission to leave phone message Yes  Some recent data might be hidden     Patient questions:  Do you have a fever, pain , or abdominal swelling? No. Pain Score  0 *  Have you tolerated food without any problems? Yes.    Have you been able to return to your normal activities? Yes.    Do you have any questions about your discharge instructions: Diet   No. Medications  No. Follow up visit  No.  Do you have questions or concerns about your Care? No.  Actions: * If pain score is 4 or above: No action needed, pain <4.

## 2016-06-17 ENCOUNTER — Telehealth: Payer: Self-pay | Admitting: Family Medicine

## 2016-06-17 DIAGNOSIS — R0602 Shortness of breath: Secondary | ICD-10-CM

## 2016-06-17 DIAGNOSIS — E7800 Pure hypercholesterolemia, unspecified: Secondary | ICD-10-CM

## 2016-06-17 MED ORDER — OMEPRAZOLE 20 MG PO CPDR: 20.0000 mg | DELAYED_RELEASE_CAPSULE | Freq: Every day | ORAL | 3 refills | Status: DC

## 2016-06-17 NOTE — Telephone Encounter (Signed)
Wife aware of all

## 2016-06-17 NOTE — Telephone Encounter (Signed)
appt scheduled for SOB Pt wants RX for Omeprazole sent into pharmacy Please advise

## 2016-06-17 NOTE — Telephone Encounter (Signed)
Please okay omeprazole 20 mg 1 daily and continue to take as the patient has been taking. Also get an appointment with the cardiologist as soon as possible. He was recently seen in the emergency room I believe for chest discomfort and shortness of breath. If you cannot get the appointment with the [place] [facility] [place] [facility] [place] Pamella [facility]ocker [place] there.

## 2016-06-21 ENCOUNTER — Ambulatory Visit (INDEPENDENT_AMBULATORY_CARE_PROVIDER_SITE_OTHER): Payer: BLUE CROSS/BLUE SHIELD | Admitting: Family Medicine

## 2016-06-21 ENCOUNTER — Encounter: Payer: Self-pay | Admitting: Family Medicine

## 2016-06-21 VITALS — BP 134/87 | HR 67 | Temp 97.0°F | Ht 72.0 in | Wt 205.0 lb

## 2016-06-21 DIAGNOSIS — R0602 Shortness of breath: Secondary | ICD-10-CM

## 2016-06-21 DIAGNOSIS — I700 Atherosclerosis of aorta: Secondary | ICD-10-CM | POA: Diagnosis not present

## 2016-06-21 DIAGNOSIS — E7800 Pure hypercholesterolemia, unspecified: Secondary | ICD-10-CM

## 2016-06-21 NOTE — Patient Instructions (Addendum)
Follow up with cardiology as planned   - our office will work out a time/ date and call you Try Livalo as recommended 2 mg on Monday Wednesday and Friday only and after 2-3 weeks of doing this patient up to 2 mg daily if you do not have any muscle aches or myalgias If the cardiac workup is negative we may consider trying an inhaler to see if this helps him and possibly a referral to the pulmonologist.

## 2016-06-21 NOTE — Progress Notes (Signed)
Subjective:    Patient ID: Spencer Stewart, male    DOB: 1956-01-23, 60 y.o.   MRN: [id number]  HPI Patient here today for some SOB  On exertion  that started about 6 mos ago. Since the patient was last here he has had a colonoscopy which apparently will not have to be repeated for 10 years. He is met with the clinical pharmacist to discuss more aggressive actions for his hyperlipidemia. He is been intolerant of Crestor. He has has some increased shortness of breath with exertion which is been noticed for about 6 months. We have had a cardiology referral placed for him on December 15. He had an EKG and chest x-ray recently. The chest x-ray did show aortic atherosclerosis and mild hyperinflation but otherwise everything was okay. The EKG had low voltage but was otherwise normal. The patient denies any chest pain. He last smoked over 30 years ago but did grow up in the family that smoked. He is not overly concerned about this but because he had a good friend that was 25 that was exercising routinely and only had slight shortness of breath and had a major heart attack, he is concerned about his heart because of his increased risk factors and what happened to his good friend.   Patient Active Problem List   Diagnosis Date Noted  . Pre-diabetes 06/01/2016  . Sports hernia-right inguinal 02/03/2014  . Hyperlipidemia 12/02/2013  . Unspecified sinusitis (chronic) 09/19/2012   Outpatient Encounter Prescriptions as of 06/21/2016  Medication Sig  . aspirin 81 MG chewable tablet Chew by mouth daily.  . diclofenac (VOLTAREN) 75 MG EC tablet TAKE ONE TABLET BY MOUTH TWICE DAILY  . Magnesium 400 MG TABS Take by mouth.  Marland Kitchen omeprazole (PRILOSEC) 20 MG capsule Take 1 capsule (20 mg total) by mouth daily.  Marland Kitchen UNABLE TO FIND Med Name: flax seed , vit d, zinc and magnesium and BCAA branch-chain amino acids  . Zinc Sulfate (ZINC-220 PO) Take by mouth.  . [DISCONTINUED] 0.9 %  sodium chloride infusion    No  facility-administered encounter medications on file as of 06/21/2016.       Review of Systems  Constitutional: Negative.   HENT: Negative.   Eyes: Negative.   Respiratory: Positive for shortness of breath (with exertion).   Cardiovascular: Negative.   Gastrointestinal: Negative.   Endocrine: Negative.   Genitourinary: Negative.   Musculoskeletal: Negative.   Skin: Negative.   Allergic/Immunologic: Negative.   Neurological: Negative.   Hematological: Negative.   Psychiatric/Behavioral: Negative.        Objective:   Physical Exam  Constitutional: He is oriented to person, place, and time. He appears well-developed and well-nourished. No distress.  HENT:  Head: Normocephalic and atraumatic.  Eyes: Conjunctivae and EOM are normal. Pupils are equal, round, and reactive to light. Right eye exhibits no discharge. Left eye exhibits no discharge. No scleral icterus.  Neck: Normal range of motion. Neck supple. No tracheal deviation present. No thyromegaly present.  Cardiovascular: Normal rate, regular rhythm and normal heart sounds.   No murmur heard. Pulmonary/Chest: Effort normal and breath sounds normal. No respiratory distress. He has no wheezes. He has no rales. He exhibits no tenderness.  Clear anteriorly and posteriorly  Abdominal: Soft. Bowel sounds are normal. He exhibits no mass. There is no tenderness. There is no rebound and no guarding.  Musculoskeletal: Normal range of motion. He exhibits no edema.  Lymphadenopathy:    He has no cervical adenopathy.  Neurological: He  is alert and oriented to person, place, and time.  Skin: Skin is warm and dry. No rash noted.  Psychiatric: He has a normal mood and affect. His behavior is normal. Judgment and thought content normal.  Nursing note and vitals reviewed.   BP 134/87 (BP Location: Right Arm)   Pulse 67   Temp 97 F (36.1 C) (Oral)   Ht 6' (1.829 m)   Wt 205 lb (93 kg)   SpO2 97%   BMI 27.80 kg/m        Assessment  & Plan:  1. SOB (shortness of breath) -This is been going on for about 6 months. It is not bothersome. He is concerned though that he has a good friend who had a major heart attack with major lifestyle changes following a heart attack is also a health nut who ran regularly and had shortness of breath. This patient also has risk factors for heart disease even though he does exercise regularly. We will send him to the cardiologist for further evaluation and possible stress test and cardiac CTA.  2. Aortic atherosclerosis ([place]) -Start Livalo 2 mg on Monday Wednesday and Friday and discontinue this if any recurrence of muscle aches and myalgias  3. Pure hypercholesterolemia -Start Livalo as indicated  Patient Instructions  Follow up with cardiology as planned   - our office will work out a time/ date and call you Try Livalo as recommended 2 mg on Monday Wednesday and Friday only and after 2-3 weeks of doing this patient up to 2 mg daily if you do not have any muscle aches or myalgias If the cardiac workup is negative we may consider trying an inhaler to see if this helps him and possibly a referral to the pulmonologist.  Arrie Senate MD

## 2016-06-24 ENCOUNTER — Encounter: Payer: Self-pay | Admitting: Cardiology

## 2016-06-24 ENCOUNTER — Telehealth: Payer: Self-pay | Admitting: Family Medicine

## 2016-06-24 ENCOUNTER — Encounter: Payer: Self-pay | Admitting: Interventional Cardiology

## 2016-06-24 ENCOUNTER — Ambulatory Visit (INDEPENDENT_AMBULATORY_CARE_PROVIDER_SITE_OTHER): Payer: BLUE CROSS/BLUE SHIELD | Admitting: Interventional Cardiology

## 2016-06-24 VITALS — BP 130/80 | HR 80 | Ht 72.0 in | Wt 204.8 lb

## 2016-06-24 DIAGNOSIS — E7800 Pure hypercholesterolemia, unspecified: Secondary | ICD-10-CM | POA: Diagnosis not present

## 2016-06-24 DIAGNOSIS — R0600 Dyspnea, unspecified: Secondary | ICD-10-CM

## 2016-06-24 DIAGNOSIS — R0609 Other forms of dyspnea: Secondary | ICD-10-CM | POA: Diagnosis not present

## 2016-06-24 DIAGNOSIS — R7303 Prediabetes: Secondary | ICD-10-CM | POA: Diagnosis not present

## 2016-06-24 NOTE — Patient Instructions (Signed)
Medication Instructions:  The current medical regimen is effective;  continue present plan and medications.  Testing/Procedures: Your physician has requested that you have an exercise tolerance test. For further information please visit [email or web address]. Please also follow instruction sheet, as given.  Follow-Up: Follow up as needed.  Thank you for choosing [facility]!!     

## 2016-06-24 NOTE — Progress Notes (Signed)
Cardiology Office Note   Date:  06/24/2016   ID:  Spencer Stewart, DOB 1955/08/08, MRN [id number]  PCP:  Redge Gainer, MD    No chief complaint on file. DOE   [facility] Readings from Last 3 Encounters:  06/24/16 204 lb 12.8 oz (92.9 kg)  06/21/16 205 lb (93 kg)  06/02/16 200 lb (90.7 kg)       History of Present Illness: Spencer Stewart is a 60 y.o. male  Who has had some DOE.  He had been feeling well.  He met someone who was very fit and had a person who had a massive MI and now needs a heart transplant.  THis person was a runner.  He was very sick and required dialysis.  THe only sx was a little DOE with the 3 mile run.    THis made the patient a little nervous.  He has noted a little SHOB with walking at a faster pace.  He is more SHOB while paying tennis.  Aortic atherosclerosis was noted on a chest xray.  Father had a stroke at age 52.  No one with stents or CABG.   Cholesterol has been high in the past.  He had muscle pain with Crestor.  He is starting Livalo 24m 3x/week.    Past Medical History:  Diagnosis Date  . Hyperlipidemia     Past Surgical History:  Procedure Laterality Date  . KNEE ARTHROSCOPY Left 2000  . TONSILLECTOMY AND ADENOIDECTOMY       Current Outpatient Prescriptions  Medication Sig Dispense Refill  . aspirin 81 MG chewable tablet Chew by mouth daily.    . Magnesium 400 MG TABS Take by mouth.    .Marland Kitchenomeprazole (PRILOSEC) 20 MG capsule Take 1 capsule (20 mg total) by mouth daily. 90 capsule 3  . Pitavastatin Calcium (LIVALO) 2 MG TABS Take 1 tablet by mouth every Monday, Wednesday, and Friday.    .Marland KitchenUNABLE TO FIND Med Name: flax seed , vit d, zinc and magnesium and BCAA branch-chain amino acids     No current facility-administered medications for this visit.     Allergies:   Codeine and Crestor [rosuvastatin]    Social History:  The patient  reports that he has quit smoking. He has quit using smokeless tobacco. He reports that he drinks  alcohol. He reports that he does not use drugs.   Family History:  The patient's family history includes Colon cancer in his cousin; Stroke in his father. Siblings 2 brothers or a sister.  ? Heart trouble in one of the brothers.   ROS:  Please see the history of present illness.   Otherwise, review of systems are positive for DOE.   All other systems are reviewed and negative.    PHYSICAL EXAM: VS:  BP 130/80 (BP Location: Left Arm)   Pulse 80   Ht 6' (1.829 m)   Wt 204 lb 12.8 oz (92.9 kg)   BMI 27.78 kg/m  , BMI Body mass index is 27.78 kg/m. GEN: Well nourished, well developed, in no acute distress , muscular HEENT: normal  Neck: no JVD, carotid bruits, or masses Cardiac: RRR; no murmurs, rubs, or gallops,no edema  Respiratory:  clear to auscultation bilaterally, normal work of breathing GI: soft, nontender, nondistended, + BS MS: no deformity or atrophy  Skin: warm and dry, no rash Neuro:  Strength and sensation are intact Psych: euthymic mood, full affect   EKG:   The ekg ordered today  demonstrates Normal ECG   Recent Labs: 04/29/2016: ALT CANCELED; ALT 53; BUN CANCELED; BUN 20; Creatinine, Ser CANCELED; Creatinine, Ser 1.16; Platelets 220; Potassium CANCELED; Potassium 4.8; Sodium CANCELED; Sodium 140   Lipid Panel    Component Value Date/Time   CHOL 190 04/29/2016 0819   TRIG 98 04/29/2016 0819   HDL 42 04/29/2016 0819   LDLCALC 138 (H) 12/03/2013 0831     Other studies Reviewed: Additional studies/ records that were reviewed today with results demonstrating: Normal ECG in the past; elevated LDL.   ASSESSMENT AND PLAN:  1. Dyspnea on exertion: Mild. We'll plan for exercise treadmill test to further evaluate. Hopefully, this will provide some reassurance as well. 2. Hyperlipidemia: He was intolerant of Crestor. His vitamin D level was low at the time. His vitamin D level is now in the normal range. It would be reasonable to try Crestor again. Could even use 10  mg once a week dosing, if the Livalo does not get him to target or is not tolerated. 3. We spoke about healthy lifestyle modifications to help decrease inflammation and reduce risk of plaque rupture. Prediabetes is listed on his problem list.    Current medicines are reviewed at length with the patient today.  The patient concerns regarding his medicines were addressed.  The following changes have been made:  No change  Labs/ tests ordered today include:  No orders of the defined types were placed in this encounter.   Recommend 150 minutes/week of aerobic exercise Low fat, low carb, high fiber diet recommended  Disposition:   FU based on stress test results   Signed, Larae Grooms, MD  06/24/2016 3:28 PM    [facility] [address], [place], [place]  [address] Phone: [phone]; Fax: [phone]

## 2016-06-24 NOTE — Telephone Encounter (Signed)
LM about cardiology appt on VM

## 2016-06-30 ENCOUNTER — Ambulatory Visit (INDEPENDENT_AMBULATORY_CARE_PROVIDER_SITE_OTHER): Payer: BLUE CROSS/BLUE SHIELD

## 2016-06-30 DIAGNOSIS — E7800 Pure hypercholesterolemia, unspecified: Secondary | ICD-10-CM | POA: Diagnosis not present

## 2016-06-30 DIAGNOSIS — R7303 Prediabetes: Secondary | ICD-10-CM | POA: Diagnosis not present

## 2016-06-30 LAB — EXERCISE TOLERANCE TEST
CHL CUP RESTING HR STRESS: 84 {beats}/min
CHL RATE OF PERCEIVED EXERTION: 16
CSEPEW: 11.7 METS
CSEPPHR: 146 {beats}/min
Exercise duration (min): 10 min
Exercise duration (sec): 0 s
MPHR: 160 {beats}/min
Percent HR: 91 %

## 2016-10-05 ENCOUNTER — Ambulatory Visit (INDEPENDENT_AMBULATORY_CARE_PROVIDER_SITE_OTHER): Payer: Self-pay | Admitting: Family Medicine

## 2016-10-05 ENCOUNTER — Encounter: Payer: Self-pay | Admitting: Family Medicine

## 2016-10-05 VITALS — BP 132/82 | HR 70 | Temp 98.2°F | Ht 72.0 in | Wt 205.0 lb

## 2016-10-05 DIAGNOSIS — M47818 Spondylosis without myelopathy or radiculopathy, sacral and sacrococcygeal region: Secondary | ICD-10-CM

## 2016-10-05 DIAGNOSIS — M7652 Patellar tendinitis, left knee: Secondary | ICD-10-CM

## 2016-10-05 DIAGNOSIS — M4698 Unspecified inflammatory spondylopathy, sacral and sacrococcygeal region: Secondary | ICD-10-CM

## 2016-10-05 DIAGNOSIS — M7711 Lateral epicondylitis, right elbow: Secondary | ICD-10-CM

## 2016-10-05 DIAGNOSIS — W57XXXA Bitten or stung by nonvenomous insect and other nonvenomous arthropods, initial encounter: Secondary | ICD-10-CM

## 2016-10-05 MED ORDER — DOXYCYCLINE HYCLATE 100 MG PO TABS: 100.0000 mg | ORAL_TABLET | Freq: Two times a day (BID) | ORAL | 0 refills | Status: DC

## 2016-10-05 MED ORDER — INDOMETHACIN ER 75 MG PO CPCR: 75.0000 mg | ORAL_CAPSULE | Freq: Two times a day (BID) | ORAL | 1 refills | Status: DC

## 2016-10-05 NOTE — Progress Notes (Signed)
BP 132/82   Pulse 70   Temp 98.2 F (36.8 C) (Oral)   Ht 6' (1.829 m)   Wt 205 lb (93 kg)   BMI 27.80 kg/m    Subjective:    Patient ID: Spencer Stewart, male    DOB: 1955/08/06, 61 y.o.   MRN: [id number]  HPI: Spencer Stewart is a 61 y.o. male presenting on 10/05/2016 for Right hip pain, left knee pain, right elbow pain (hip pain began about 2 months ago, patient wants to make you aware he has had RMSF 3 times, )   HPI Right low back pain and right elbow pain and left knee pain Patient comes in with multiple joint complaints and is concerned for possible [employer] [facility] spotted fever because he has had it previously. He has been having right low back pain in the sacral region that does not radiate anywhere else and that is rated as a 2 out of 10 but has been bothering him a lot more when he is on his feet for prolonged periods but gets better with walking. He is also been having left knee pain which he has had off and on chronically but has been worse over the past couple months. He does not know if he's been compensating because of the right hip. The left knee hurts anteriorly below the patella. He does not recall any specific location that he had a tick bite but he does work outside a lot and thinks he may have been exposed to one at some point or another. He denies any numbness or weakness in his arms or his legs or his back. He denies any loss of range of motion.  Relevant past medical, surgical, family and social history reviewed and updated as indicated. Interim medical history since our last visit reviewed. Allergies and medications reviewed and updated.  Review of Systems  Constitutional: Positive for fatigue. Negative for chills and fever.  Respiratory: Negative for shortness of breath and wheezing.   Cardiovascular: Negative for chest pain and leg swelling.  Musculoskeletal: Positive for arthralgias, back pain and myalgias. Negative for gait problem and joint swelling.  Skin: Negative  for rash.  All other systems reviewed and are negative.   Per HPI unless specifically indicated above     Objective:    BP 132/82   Pulse 70   Temp 98.2 F (36.8 C) (Oral)   Ht 6' (1.829 m)   Wt 205 lb (93 kg)   BMI 27.80 kg/m   Wt Readings from Last 3 Encounters:  10/05/16 205 lb (93 kg)  06/24/16 204 lb 12.8 oz (92.9 kg)  06/21/16 205 lb (93 kg)    Physical Exam  Constitutional: He is oriented to person, place, and time. He appears well-developed and well-nourished. No distress.  Eyes: Conjunctivae are normal. No scleral icterus.  Neck: Neck supple.  Cardiovascular: Normal rate, regular rhythm, normal heart sounds and intact distal pulses.   No murmur heard. Pulmonary/Chest: Effort normal and breath sounds normal. No respiratory distress. He has no wheezes. He has no rales.  Musculoskeletal: Normal range of motion. He exhibits no edema.       Right elbow: He exhibits normal range of motion. Tenderness found. Lateral epicondyle tenderness noted. No radial head, no medial epicondyle and no olecranon process tenderness noted.       Left knee: He exhibits no swelling, normal alignment, no LCL laxity, normal patellar mobility, no bony tenderness, normal meniscus and no MCL laxity. Tenderness found.  Patellar tendon tenderness noted.       Lumbar back: He exhibits tenderness (Right lower SI joint pain). He exhibits normal range of motion and normal pulse.  Lymphadenopathy:    He has no cervical adenopathy.  Neurological: He is alert and oriented to person, place, and time. Coordination normal.  Skin: Skin is warm and dry. No rash noted. He is not diaphoretic.  Psychiatric: He has a normal mood and affect. His behavior is normal.  Nursing note and vitals reviewed.     Assessment & Plan:   Problem List Items Addressed This Visit    None    Visit Diagnoses    SI joint arthritis (HCC)    -  Primary   Right side, we'll try indomethacin   Relevant Medications   indomethacin  (INDOCIN SR) 75 MG CR capsule   Patellar tendinitis, left knee       Relevant Medications   indomethacin (INDOCIN SR) 75 MG CR capsule   Right lateral epicondylitis       Relevant Medications   indomethacin (INDOCIN SR) 75 MG CR capsule   Tick bite, initial encounter       Patient has unknown location for tick bite, thinks he may have been bitten again, has had recommend spotted fever again and feels the same that he did previousl   Relevant Medications   doxycycline (VIBRA-TABS) 100 MG tablet       Follow up plan: Return if symptoms worsen or fail to improve.  Counseling provided for all of the vaccine components No orders of the defined types were placed in this encounter.   Arville Care, MD [facility] 10/05/2016, 3:20 PM

## 2016-10-18 ENCOUNTER — Ambulatory Visit: Payer: BLUE CROSS/BLUE SHIELD | Admitting: Physician Assistant

## 2016-11-22 ENCOUNTER — Encounter: Payer: Self-pay | Admitting: Physician Assistant

## 2016-11-22 ENCOUNTER — Ambulatory Visit (INDEPENDENT_AMBULATORY_CARE_PROVIDER_SITE_OTHER): Payer: Self-pay | Admitting: Physician Assistant

## 2016-11-22 VITALS — BP 132/86 | HR 82 | Temp 97.7°F | Ht 72.0 in | Wt 205.8 lb

## 2016-11-22 DIAGNOSIS — E349 Endocrine disorder, unspecified: Secondary | ICD-10-CM

## 2016-11-22 DIAGNOSIS — S76311A Strain of muscle, fascia and tendon of the posterior muscle group at thigh level, right thigh, initial encounter: Secondary | ICD-10-CM

## 2016-11-22 DIAGNOSIS — S76319A Strain of muscle, fascia and tendon of the posterior muscle group at thigh level, unspecified thigh, initial encounter: Secondary | ICD-10-CM

## 2016-11-22 MED ORDER — TESTOSTERONE CYPIONATE 200 MG/ML IM SOLN: 200.0000 mg | INTRAMUSCULAR | 5 refills | Status: DC

## 2016-11-22 NOTE — Patient Instructions (Signed)
Hamstring Strain  A hamstring strain is an injury that occurs when the hamstring muscles are overstretched or overloaded. The hamstring muscles are a group of muscles at the back of the thighs. These muscles are used in straightening the hips, bending the knees, and pulling back the legs.  This type of injury is often called a pulled hamstring muscle. The severity of a muscle strain is rated in degrees. First-degree strains have the least amount of muscle fiber tearing and pain. Second-degree and third-degree strains have increasingly more tearing and pain.  What are the causes?  Hamstring strains occur when a sudden, violent force is placed on these muscles and stretches them too far. This often occurs during activities that involve running, jumping, kicking, or weight lifting.  What increases the risk?  Hamstring strains are especially common in athletes. Other things that can increase your risk for this injury include:  · Having low strength, endurance, or flexibility of the hamstring muscles.  · Performing high-impact physical activity.  · Having poor physical fitness.  · Having a previous leg injury.  · Having fatigued muscles.  · Older age.    What are the signs or symptoms?  · Pain in the back of the thigh.  · Bruising.  · Swelling.  · Muscle spasm.  · Difficulty using the muscle because of pain or lack of normal function.  For severe strains, you may have a popping or snapping feeling when the injury occurs.  How is this diagnosed?  Your health care provider will perform a physical exam and ask about your medical history.  How is this treated?  Often, the best treatment for a hamstring strain is protecting, resting, icing, applying compression, and elevating the injured area. This is referred to as the PRICE method of treatment. Your health care provider may also recommend medicines to help reduce pain or inflammation.  Follow these instructions at home:  · Use the PRICE method of treatment to promote muscle  healing during the first 2-3 days after your injury. The PRICE method involves:  ? P--Protecting the muscle from being injured again.  ? R--Restricting your activity and resting the injured body part.  ? I--Icing your injury. To do this, put ice in a plastic bag. Place a towel between your skin and the bag. Then, apply the ice and leave it on for 20 minutes, 2-3 times per day. After the third day, switch to moist heat packs.  ? C--Applying compression to the injured area with an elastic bandage. Be careful not to wrap it too tightly. That may interfere with blood circulation or may increase swelling.  ? E--Elevating the injured body part above the level of your heart as often as you can. You can do this by putting a pillow under your thigh when you sit or lie down.  · Take medicines only as directed by your health care provider.  · Begin exercising or stretching as directed by your health care provider.  · Do not return to full activity level until your health care provider approves.  · Keep all follow-up visits as directed by your health care provider. This is important.  Contact a health care provider if:  · You have increasing pain or swelling in the injured area.  · You have numbness, tingling, or a significant loss of strength in the injured area.  · Your foot or your toes become cold or turn blue.  This information is not intended to replace advice given to you 

## 2016-11-22 NOTE — Progress Notes (Signed)
BP 132/86   Pulse 82   Temp 97.7 F (36.5 C) (Oral)   Ht 6' (1.829 m)   Wt 205 lb 12.8 oz (93.4 kg)   BMI 27.91 kg/m    Subjective:    Patient ID: Spencer Stewart, male    DOB: 09-29-1955, 62 y.o.   MRN: [id number]  HPI: Spencer Stewart is a 61 y.o. male presenting on 11/22/2016 for Hamstring tear on the right side on 11/20/2016. Proximal 2 days ago the patient was playing doubles tennis. He was close to the neck and the ball was short and he reached suddenly and lunged and had immediate pop in the right quadriceps. And he was immediately debilitated and could not walk on it. He is a very active man with aerobic and weightlifting regimen. He has had a known injury of his left hamstring in the past. The worst that his hamstring ever bothered him was for about 4 months. He is greatly concerned about eating is healed. We have discussed the benefits of sports medicine and we will send him to Dr. Margaretha Sheffield as soon as possible. 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 He had 2 levels of 160 and 284. He does not have insurance that we have to get approval through.  Ready to start medication at 200 mg/ml every 2 weeks. Script is printed.  Relevant past medical, surgical, family and social history reviewed and updated as indicated. Allergies and medications reviewed and updated.  Past Medical History:  Diagnosis Date  . Hyperlipidemia     Past Surgical History:  Procedure Laterality Date  . KNEE ARTHROSCOPY Left 2000  . TONSILLECTOMY AND ADENOIDECTOMY      Review of Systems  Constitutional: Negative.  Negative for appetite change and fatigue.  HENT: Negative.   Eyes: Negative.  Negative for pain and visual disturbance.  Respiratory: Negative.  Negative for cough, chest tightness, shortness of breath and wheezing.   Cardiovascular: Negative.  Negative for chest pain, palpitations and leg swelling.  Gastrointestinal:  Negative.  Negative for abdominal pain, diarrhea, nausea and vomiting.  Endocrine: Negative.   Genitourinary: Negative.   Musculoskeletal: Positive for arthralgias, gait problem, joint swelling and myalgias.  Skin: Negative.  Negative for color change and rash.  Neurological: Negative for weakness, numbness and headaches.  Psychiatric/Behavioral: Negative.     Allergies as of 11/22/2016      Reactions   Codeine    Crestor [rosuvastatin] Other (See Comments)   Causes myalgia      Medication List       Accurate as of 11/22/16  2:36 PM. Always use your most recent med list.          aspirin 81 MG chewable tablet Chew by mouth daily.   indomethacin 75 MG CR capsule Commonly known as:  INDOCIN SR Take 1 capsule (75 mg total) by mouth 2 (two) times daily with a meal.   Magnesium 400 MG Tabs Take by mouth.   omeprazole 20 MG capsule Commonly known as:  PRILOSEC Take 1 capsule (20 mg total) by mouth daily.   testosterone cypionate 200 MG/ML injection Commonly known as:  DEPO-TESTOSTERONE Inject 1 mL (200 mg total) into the muscle every 14 (fourteen) days.   UNABLE TO FIND Med Name: flax seed , vit d, zinc and magnesium and BCAA branch-chain amino acids          Objective:    BP 132/86  Pulse 82   Temp 97.7 F (36.5 C) (Oral)   Ht 6' (1.829 m)   Wt 205 lb 12.8 oz (93.4 kg)   BMI 27.91 kg/m   Allergies  Allergen Reactions  . Codeine   . Crestor [Rosuvastatin] Other (See Comments)    Causes myalgia    Physical Exam  Constitutional: He appears well-developed and well-nourished.  HENT:  Head: Normocephalic and atraumatic.  Eyes: Conjunctivae and EOM are normal. Pupils are equal, round, and reactive to light.  Neck: Normal range of motion. Neck supple.  Cardiovascular: Normal rate, regular rhythm and normal heart sounds.   Pulmonary/Chest: Effort normal and breath sounds normal.  Abdominal: Soft. Bowel sounds are normal.  Musculoskeletal: Normal range of  motion.       Right upper leg: He exhibits tenderness and swelling.       Legs: Hamstring with bruising at the proximal head and bruising at the distal portion. The entire thigh is greatly swollen compared to the other side. Tenderness throughout the entire area.  Skin: Skin is warm and dry.        Assessment & Plan:   1. Hamstring tear - Ambulatory referral to Sports Medicine  2. Hypotestosteronemia Prior levels were 160 and 284,  - testosterone cypionate (DEPO-TESTOSTERONE) 200 MG/ML injection; Inject 1 mL (200 mg total) into the muscle every 14 (fourteen) days.  Dispense: 10 mL; Refill: 5   Current Outpatient Prescriptions:  .  aspirin 81 MG chewable tablet, Chew by mouth daily., Disp: , Rfl:  .  indomethacin (INDOCIN SR) 75 MG CR capsule, Take 1 capsule (75 mg total) by mouth 2 (two) times daily with a meal., Disp: 60 capsule, Rfl: 1 .  Magnesium 400 MG TABS, Take by mouth., Disp: , Rfl:  .  omeprazole (PRILOSEC) 20 MG capsule, Take 1 capsule (20 mg total) by mouth daily., Disp: 90 capsule, Rfl: 3 .  UNABLE TO FIND, Med Name: flax seed , vit d, zinc and magnesium and BCAA branch-chain amino acids, Disp: , Rfl:  .  testosterone cypionate (DEPO-TESTOSTERONE) 200 MG/ML injection, Inject 1 mL (200 mg total) into the muscle every 14 (fourteen) days., Disp: 10 mL, Rfl: 5  Continue all other maintenance medications as listed above.  Follow up plan: Return if symptoms worsen or fail to improve, for testosterone inj q 2week.  Educational handout given for hamstring tear  Remus LofflerAngel S. Dsean Vantol PA-C [facility] [address]  [place], [place] [address] [phone]   11/22/2016, 2:36 PM

## 2016-11-24 ENCOUNTER — Other Ambulatory Visit: Payer: Self-pay | Admitting: Family Medicine

## 2016-11-25 ENCOUNTER — Ambulatory Visit (INDEPENDENT_AMBULATORY_CARE_PROVIDER_SITE_OTHER): Payer: Self-pay | Admitting: Orthopedic Surgery

## 2016-11-25 ENCOUNTER — Encounter (INDEPENDENT_AMBULATORY_CARE_PROVIDER_SITE_OTHER): Payer: Self-pay | Admitting: Orthopedic Surgery

## 2016-11-25 ENCOUNTER — Other Ambulatory Visit: Payer: Self-pay | Admitting: Physician Assistant

## 2016-11-25 ENCOUNTER — Ambulatory Visit (INDEPENDENT_AMBULATORY_CARE_PROVIDER_SITE_OTHER): Payer: Self-pay

## 2016-11-25 VITALS — Ht 72.0 in | Wt 205.0 lb

## 2016-11-25 DIAGNOSIS — S73191A Other sprain of right hip, initial encounter: Secondary | ICD-10-CM

## 2016-11-25 DIAGNOSIS — S76391A Other specified injury of muscle, fascia and tendon of the posterior muscle group at thigh level, right thigh, initial encounter: Principal | ICD-10-CM

## 2016-11-25 DIAGNOSIS — IMO0001 Reserved for inherently not codable concepts without codable children: Secondary | ICD-10-CM | POA: Insufficient documentation

## 2016-11-25 MED ORDER — SOMATROPIN 0.2 MG ~~LOC~~ SOLR: 0.2000 mg | Freq: Every day | SUBCUTANEOUS | 5 refills | Status: DC

## 2016-11-25 NOTE — Progress Notes (Signed)
   Office Visit Note   Patient: Spencer Stewart           Date of Birth: [birth year]           MRN: [id number] Visit Date: 11/25/2016              Requested by: Ernestina PennaMoore, Donald W, MD [address] [place], [place] [id number] PCP: Ernestina PennaMoore, Donald W, MD  Chief Complaint  Patient presents with  . Right Leg - Pain    DOI 11/20/16 right hamstring tear       HPI: Patient is a 61 year old gentleman who was playing tennis had a eccentric contracture of his right hamstring had an immediate onset of pain. Pain at the proximal third of the hamstring on the right. Patient does take an aspirin a day. Patient noticed increasing bruising over the next several days.  Assessment & Plan: Visit Diagnoses:  1. Hamstring sprain, right, initial encounter     Plan: Recommended compression he stretching and slow strengthening.  Follow-Up Instructions: Return if symptoms worsen or fail to improve.   Ortho Exam  Patient is alert, oriented, no adenopathy, well-dressed, normal affect, normal respiratory effort. Examination patient does have an antalgic gait. He has no tenderness to palpation of the ischial tuberosity is more tender to palpation of the right hamstring at the musculotendinous junction proximally. Distally there is no tenderness. Patient has full range of motion of the hip knee and ankle.  Imaging: Xr Pelvis 1-2 Views  Result Date: 11/25/2016 AP of the pelvis shows no evidence of an ischial tuberosity avulsion on the right. 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   Labs: Lab Results  Component Value Date   HGBA1C 6.1 (H) 04/29/2016   HGBA1C 5.5% 12/04/2013    Orders:  Orders Placed This Encounter  Procedures  . XR Pelvis 1-2 Views   No orders of the defined types were placed in this encounter.    Procedures: No procedures performed  Clinical Data: No additional  findings.  ROS:  All other systems negative, except as noted in the HPI. Review of Systems  Objective: Vital Signs: Ht 6' (1.829 m)   Wt 205 lb (93 kg)   BMI 27.80 kg/m   Specialty Comments:  No specialty comments available.  PMFS History: Patient Active Problem List   Diagnosis Date Noted  . Hamstring sprain, right, initial encounter 11/25/2016  . Hamstring tear 11/22/2016  . Hypotestosteronemia 11/22/2016  . Pre-diabetes 06/01/2016  . Sports hernia-right inguinal 02/03/2014  . Hyperlipidemia 12/02/2013  . Unspecified sinusitis (chronic) 09/19/2012   Past Medical History:  Diagnosis Date  . Hyperlipidemia     Family History  Problem Relation Age of Onset  . Stroke Father   . Colon cancer Cousin     Past Surgical History:  Procedure Laterality Date  . KNEE ARTHROSCOPY Left 2000  . TONSILLECTOMY AND ADENOIDECTOMY     Social History   Occupational History  . Not on file.   Social History Main Topics  . Smoking status: Former Games developermoker  . Smokeless tobacco: Former NeurosurgeonUser  . Alcohol use Yes     Comment: twice weekly  . Drug use: No  . Sexual activity: Not on file

## 2016-12-07 ENCOUNTER — Ambulatory Visit: Payer: BLUE CROSS/BLUE SHIELD | Admitting: Family Medicine

## 2017-01-16 ENCOUNTER — Telehealth: Payer: Self-pay | Admitting: Family Medicine

## 2017-01-16 DIAGNOSIS — M47818 Spondylosis without myelopathy or radiculopathy, sacral and sacrococcygeal region: Secondary | ICD-10-CM

## 2017-01-16 NOTE — Telephone Encounter (Signed)
We can go ahead and put in a referral again for him.

## 2017-01-16 NOTE — Telephone Encounter (Signed)
Please advise Looks like pt had referral on 11/22/2016 but canceled

## 2017-01-16 NOTE — Telephone Encounter (Signed)
What type of referral do you need? For the si joint issue.   Have you been seen at our office for this problem? yes (If no, schedule them an appointment.  They will need to be seen before a referral can be done.)  Is there a particular doctor or location that you prefer? Closer is better but so is less expensive. He is self pay.   Patient notified that referrals can take up to a week or longer to process. If they haven't heard anything within a week they should call back and speak with the referral department.

## 2017-01-17 ENCOUNTER — Encounter: Payer: Self-pay | Admitting: Family Medicine

## 2017-01-17 ENCOUNTER — Ambulatory Visit (INDEPENDENT_AMBULATORY_CARE_PROVIDER_SITE_OTHER): Payer: Self-pay | Admitting: Family Medicine

## 2017-01-17 VITALS — BP 172/89 | HR 77 | Ht 71.0 in | Wt 196.0 lb

## 2017-01-17 DIAGNOSIS — M545 Low back pain: Secondary | ICD-10-CM

## 2017-01-17 DIAGNOSIS — M533 Sacrococcygeal disorders, not elsewhere classified: Secondary | ICD-10-CM

## 2017-01-17 DIAGNOSIS — G8929 Other chronic pain: Secondary | ICD-10-CM

## 2017-01-17 MED ORDER — METHYLPREDNISOLONE ACETATE 40 MG/ML IJ SUSP
40.0000 mg | Freq: Once | INTRAMUSCULAR | Status: AC
  Administered 2017-01-17: 40 mg via INTRA_ARTICULAR

## 2017-01-17 NOTE — Patient Instructions (Signed)
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 You were given an injection today. Do home stretches twice a day - hold stretches for 20-30 seconds, repeat 3 times. No restrictions though may want to wait 5-7 days before doing a lot of cutting activities, pivoting. Consider chiropractic care, physical therapy if you continue to struggle. I don't see any reason to pursue MRI based on your exam but can consider this if you're still not improving. Follow up with me in 1 month for reevaluation.

## 2017-01-18 DIAGNOSIS — M545 Low back pain, unspecified: Secondary | ICD-10-CM | POA: Insufficient documentation

## 2017-01-18 NOTE — Progress Notes (Signed)
PCP: Ernestina PennaMoore, Donald W, MD Consultation requested by Arville CareJoshua Dettinger MD  Subjective:   HPI: Patient is a 61 y.o. male here for low back pain.  Patient reports he's had about 1 - 1 1/2 years of right side low back pain. Pain is 3/10 level but up to 7/10 and sharp at the worst. Flared up really bad last Saturday. Advil has helped previously. Some discomfort with sitting. Bothers with sleep also. Tried indomethacin without much benefit. No radiation into legs. No bowel/bladder dysfunction. No skin changes, numbness. Recently pulled his hamstring too but this has improved. Doing home exercises, stretches.  Past Medical History:  Diagnosis Date  . Hyperlipidemia     Current Outpatient Prescriptions on File Prior to Visit  Medication Sig Dispense Refill  . aspirin 81 MG chewable tablet Chew by mouth daily.    . indomethacin (INDOCIN SR) 75 MG CR capsule Take 1 capsule (75 mg total) by mouth 2 (two) times daily with a meal. 60 capsule 1  . indomethacin (INDOCIN) 50 MG capsule TAKE 1 CAPSULE 3 TIMES DAILY WITH MEALS 90 capsule 0  . Magnesium 400 MG TABS Take by mouth.    Marland Kitchen. omeprazole (PRILOSEC) 20 MG capsule Take 1 capsule (20 mg total) by mouth daily. 90 capsule 3  . Somatropin (GENOTROPIN) 0.2 MG SOLR Inject 0.2 mg into the skin daily. 58.5 each 5  . testosterone cypionate (DEPO-TESTOSTERONE) 200 MG/ML injection Inject 1 mL (200 mg total) into the muscle every 14 (fourteen) days. 10 mL 5  . UNABLE TO FIND Med Name: flax seed , vit d, zinc and magnesium and BCAA branch-chain amino acids     No current facility-administered medications on file prior to visit.     Past Surgical History:  Procedure Laterality Date  . KNEE ARTHROSCOPY Left 2000  . TONSILLECTOMY AND ADENOIDECTOMY      Allergies  Allergen Reactions  . Codeine   . Crestor [Rosuvastatin] Other (See Comments)    Causes myalgia    Social History   Social History  . Marital status: Married    Spouse name: N/A   . Number of children: N/A  . Years of education: N/A   Occupational History  . Not on file.   Social History Main Topics  . Smoking status: Former Games developermoker  . Smokeless tobacco: Former NeurosurgeonUser  . Alcohol use Yes     Comment: twice weekly  . Drug use: No  . Sexual activity: Not on file   Other Topics Concern  . Not on file   Social History Narrative  . No narrative on file    Family History  Problem Relation Age of Onset  . Stroke Father   . Colon cancer Cousin     BP (!) 172/89   Pulse 77   Ht 5\' 11"  (1.803 m)   Wt 196 lb (88.9 kg)   BMI 27.34 kg/m   Review of Systems: See HPI above.     Objective:  Physical Exam:  Gen: NAD, comfortable in exam room  Back: No gross deformity, scoliosis. TTP mildly right SI joint.  No other tenderness.  No midline or bony TTP. FROM without pain. Strength LEs 5/5 all muscle groups.   2+ MSRs in patellar and achilles tendons, equal bilaterally. Negative SLRs. Sensation intact to light touch bilaterally. Negative logroll bilateral hips Negative fabers and piriformis stretches.  Decreased motion right > left SI joints.   Assessment & Plan:  1. Low back pain - consistent with SI joint  dysfunction.  He is very active, has been doing home exercises and stretches for this.  Tried advil and indomethacin.  Went ahead with SI joint injection today.  Reviewed stretches again.  Advised to consider chiropractic care, PT specifically for SI joint if not improving.  F/u in 1 month.  After informed written consent patient was lying prostrate on exam table.  Right SI joint identified with MSK u/s.  Area prepped with alcohol swab then right SI joint injected with 2:1 bupivicaine: depomedrol.  Patient tolerated procedure well without immediate complications.

## 2017-01-18 NOTE — Assessment & Plan Note (Signed)
consistent with SI joint dysfunction.  He is very active, has been doing home exercises and stretches for this.  Tried advil and indomethacin.  Went ahead with SI joint injection today.  Reviewed stretches again.  Advised to consider chiropractic care, PT specifically for SI joint if not improving.  F/u in 1 month.  After informed written consent patient was lying prostrate on exam table.  Right SI joint identified with MSK u/s.  Area prepped with alcohol swab then right SI joint injected with 2:1 bupivicaine: depomedrol.  Patient tolerated procedure well without immediate complications.

## 2017-02-17 ENCOUNTER — Ambulatory Visit: Payer: Self-pay | Admitting: Family Medicine

## 2017-05-10 ENCOUNTER — Ambulatory Visit: Payer: BLUE CROSS/BLUE SHIELD | Admitting: Family Medicine

## 2017-05-31 ENCOUNTER — Other Ambulatory Visit: Payer: Self-pay | Admitting: Physician Assistant

## 2017-05-31 DIAGNOSIS — E349 Endocrine disorder, unspecified: Secondary | ICD-10-CM

## 2017-10-10 ENCOUNTER — Ambulatory Visit (INDEPENDENT_AMBULATORY_CARE_PROVIDER_SITE_OTHER): Payer: Self-pay | Admitting: Physician Assistant

## 2017-10-10 ENCOUNTER — Encounter: Payer: Self-pay | Admitting: Physician Assistant

## 2017-10-10 VITALS — BP 125/75 | HR 71 | Temp 97.9°F | Ht 71.0 in | Wt 207.6 lb

## 2017-10-10 DIAGNOSIS — M47818 Spondylosis without myelopathy or radiculopathy, sacral and sacrococcygeal region: Secondary | ICD-10-CM

## 2017-10-10 DIAGNOSIS — E7841 Elevated Lipoprotein(a): Secondary | ICD-10-CM

## 2017-10-10 DIAGNOSIS — M7652 Patellar tendinitis, left knee: Secondary | ICD-10-CM

## 2017-10-10 DIAGNOSIS — E349 Endocrine disorder, unspecified: Secondary | ICD-10-CM

## 2017-10-10 DIAGNOSIS — M7711 Lateral epicondylitis, right elbow: Secondary | ICD-10-CM

## 2017-10-10 MED ORDER — INDOMETHACIN ER 75 MG PO CPCR: 75.0000 mg | ORAL_CAPSULE | Freq: Two times a day (BID) | ORAL | 5 refills | Status: DC

## 2017-10-10 MED ORDER — THYROID 15 MG PO TABS: 15.0000 mg | ORAL_TABLET | Freq: Every day | ORAL | Status: DC

## 2017-10-10 MED ORDER — SOMATROPIN 0.2 MG ~~LOC~~ SOLR: 0.2000 mg | Freq: Every day | SUBCUTANEOUS | 5 refills | Status: DC

## 2017-10-10 MED ORDER — TESTOSTERONE CYPIONATE 200 MG/ML IM SOLN: INTRAMUSCULAR | 2 refills | Status: DC

## 2017-10-10 MED ORDER — PRAVASTATIN SODIUM 20 MG PO TABS: 20.0000 mg | ORAL_TABLET | Freq: Every day | ORAL | 3 refills | Status: DC

## 2017-10-11 NOTE — Progress Notes (Signed)
BP 125/75   Pulse 71   Temp 97.9 F (36.6 C) (Oral)   Ht 5\' 11"  (1.803 m)   Wt 207 lb 9.6 oz (94.2 kg)   BMI 28.95 kg/m    Subjective:    Patient ID: Spencer Stewart, male    DOB: Apr 15, 1956, 62 y.o.   MRN: [id number]  HPI: Spencer Stewart is a 62 y.o. male presenting on 10/10/2017 for Medication Refill  His LDL was slightly elevated.  We have had a long discussion about treatment of this.  He does have a strong family history of cardiovascular disease.  He is an avid exerciser and keeps a very healthy diet.  He tried Crestor in the past and had severe myalgias.  We discussed pravastatin as an option that might be tolerated and may give some reduction in his cholesterol.  He is interested in trying this.  He does need refills on his other medications.  He is quite stable at this time and has no complaints.    He will sometimes have hip and joint pain.  It will sometimes bother him in his left knee.  And his right elbow.  He uses some over-the-counter medications and sometimes an anti-inflammatory.  It is not a long duration condition.  Patient has hypo-testosterone.  Refills will be sent today.   Patient comes in for recheck on his medications.  Looking at his last labs  Past Medical History:  Diagnosis Date  . Hyperlipidemia    Relevant past medical, surgical, family and social history reviewed and updated as indicated. Interim medical history since our last visit reviewed. Allergies and medications reviewed and updated. DATA REVIEWED: CHART IN EPIC  Family History reviewed for pertinent findings.  Review of Systems  Constitutional: Negative.  Negative for appetite change and fatigue.  HENT: Negative.   Eyes: Negative.  Negative for pain and visual disturbance.  Respiratory: Negative.  Negative for cough, chest tightness, shortness of breath and wheezing.   Cardiovascular: Negative.  Negative for chest pain, palpitations and leg swelling.  Gastrointestinal: Negative.  Negative for  abdominal pain, diarrhea, nausea and vomiting.  Endocrine: Negative.   Genitourinary: Negative.   Musculoskeletal: Positive for arthralgias and myalgias.  Skin: Negative.  Negative for color change and rash.  Neurological: Negative.  Negative for weakness, numbness and headaches.  Psychiatric/Behavioral: Negative.     Allergies as of 10/10/2017      Reactions   Codeine    Crestor [rosuvastatin] Other (See Comments)   Causes myalgia      Medication List        Accurate as of 10/10/17 11:59 PM. Always use your most recent med list.          aspirin 81 MG chewable tablet Chew by mouth daily.   indomethacin 75 MG CR capsule Commonly known as:  INDOCIN SR Take 1 capsule (75 mg total) by mouth 2 (two) times daily with a meal.   Magnesium 400 MG Tabs Take by mouth.   omeprazole 20 MG capsule Commonly known as:  PRILOSEC Take 1 capsule (20 mg total) by mouth daily.   pravastatin 20 MG tablet Commonly known as:  PRAVACHOL Take 1 tablet (20 mg total) by mouth daily.   Somatropin 0.2 MG Solr Commonly known as:  GENOTROPIN Inject 0.2 mg into the skin daily.   testosterone cypionate 200 MG/ML injection Commonly known as:  DEPOTESTOSTERONE CYPIONATE INJECT 1ML IM EVERY 14 DAYS   thyroid 15 MG tablet Commonly known as:  ARMOUR THYROID Take 1 tablet (15 mg total) by mouth daily.   UNABLE TO FIND Med Name: flax seed , vit d, zinc and magnesium and BCAA branch-chain amino acids          Objective:    BP 125/75   Pulse 71   Temp 97.9 F (36.6 C) (Oral)   Ht 5\' 11"  (1.803 m)   Wt 207 lb 9.6 oz (94.2 kg)   BMI 28.95 kg/m   Allergies  Allergen Reactions  . Codeine   . Crestor [Rosuvastatin] Other (See Comments)    Causes myalgia    Wt Readings from Last 3 Encounters:  10/10/17 207 lb 9.6 oz (94.2 kg)  01/17/17 196 lb (88.9 kg)  11/25/16 205 lb (93 kg)    Physical Exam  Constitutional: He appears well-developed and well-nourished.  HENT:  Head: Normocephalic  and atraumatic.  Eyes: Pupils are equal, round, and reactive to light. Conjunctivae and EOM are normal.  Neck: Normal range of motion. Neck supple.  Cardiovascular: Normal rate, regular rhythm and normal heart sounds.  Pulmonary/Chest: Effort normal and breath sounds normal.  Abdominal: Soft. Bowel sounds are normal.  Musculoskeletal:       Right elbow: He exhibits decreased range of motion. Tenderness found.       Right hip: He exhibits tenderness. He exhibits normal strength.       Right knee: Tenderness found.       Arms:      Legs: Skin: Skin is warm and dry.        Assessment & Plan:   1. Hypotestosteronemia - testosterone cypionate (DEPOTESTOSTERONE CYPIONATE) 200 MG/ML injection; INJECT IM EVERY 14 DAYS  Dispense: 10 mL; Refill: 2  2. Elevated lipoprotein(a)  3. SI joint arthritis - indomethacin (INDOCIN SR) 75 MG CR capsule; Take 1 capsule (75 mg total) by mouth 2 (two) times daily with a meal.  Dispense: 60 capsule; Refill: 5  4. Patellar tendinitis, left knee - indomethacin (INDOCIN SR) 75 MG CR capsule; Take 1 capsule (75 mg total) by mouth 2 (two) times daily with a meal.  Dispense: 60 capsule; Refill: 5  5. Right lateral epicondylitis - indomethacin (INDOCIN SR) 75 MG CR capsule; Take 1 capsule (75 mg total) by mouth 2 (two) times daily with a meal.  Dispense: 60 capsule; Refill: 5   Continue all other maintenance medications as listed above.  Follow up plan: Recheck 1 year  Educational handout given for survey  Remus Loffler PA-C [facility] [address]  [place], [place] [phone]   10/11/2017, 1:40 PM

## 2018-01-18 ENCOUNTER — Telehealth: Payer: Self-pay | Admitting: *Deleted

## 2018-01-18 NOTE — Telephone Encounter (Signed)
Pt is self pay and prefers to skip the ov and lumbar spine films here - and just go ahead with the MRI.  He will have to pay for it all anyway -- if approved for this - could we have Spencer Stewart / Spencer Stewart price check where would be the cheapest for him. He is willing to travel to [place] or [place] for a better price.   Please have nurse call him with an update on whether he ntbs or not.

## 2018-01-18 NOTE — Telephone Encounter (Signed)
He has this as a standing diagnosis and is appropriate to have an MRI ordered.  Just pass it on to Palmerarlon or Debbie and let me know what to do

## 2018-01-23 ENCOUNTER — Other Ambulatory Visit: Payer: Self-pay | Admitting: Physician Assistant

## 2018-01-23 DIAGNOSIS — M545 Low back pain: Secondary | ICD-10-CM

## 2018-01-28 ENCOUNTER — Ambulatory Visit
Admission: RE | Admit: 2018-01-28 | Discharge: 2018-01-28 | Disposition: A | Discharge status: Home / Self Care | Payer: No Typology Code available for payment source | Source: Ambulatory Visit | Attending: Physician Assistant | Admitting: Physician Assistant

## 2018-01-28 DIAGNOSIS — M545 Low back pain: Secondary | ICD-10-CM

## 2018-01-29 ENCOUNTER — Telehealth: Payer: Self-pay | Admitting: Family Medicine

## 2018-01-30 ENCOUNTER — Other Ambulatory Visit: Payer: Self-pay | Admitting: Physician Assistant

## 2018-01-30 DIAGNOSIS — M5136 Other intervertebral disc degeneration, lumbar region: Secondary | ICD-10-CM

## 2018-01-30 DIAGNOSIS — M5126 Other intervertebral disc displacement, lumbar region: Secondary | ICD-10-CM

## 2018-01-30 DIAGNOSIS — M48061 Spinal stenosis, lumbar region without neurogenic claudication: Secondary | ICD-10-CM

## 2018-01-30 NOTE — Telephone Encounter (Signed)
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

## 2018-01-30 NOTE — Telephone Encounter (Signed)
It appears that his MRI result has probably already been closed.  And that I recommended that he see a neurosurgeon.  And according to the message by the nurse, the patient was can ask around to see who wanted to see.  And appears I think by this message, that he would like to see Dr. Yetta BarreJones.,  It is [facility]av[facility] [place].  I will place the referral.  Please call the patient to assure that this is correct.

## 2018-01-30 NOTE — Telephone Encounter (Signed)
Patient aware.

## 2018-01-30 NOTE — Telephone Encounter (Signed)
What is the question? 

## 2018-02-02 ENCOUNTER — Other Ambulatory Visit: Payer: Self-pay | Admitting: Physician Assistant

## 2018-02-02 DIAGNOSIS — E349 Endocrine disorder, unspecified: Secondary | ICD-10-CM

## 2018-05-28 ENCOUNTER — Ambulatory Visit (INDEPENDENT_AMBULATORY_CARE_PROVIDER_SITE_OTHER): Payer: Self-pay | Admitting: Nurse Practitioner

## 2018-05-28 ENCOUNTER — Encounter: Payer: Self-pay | Admitting: Nurse Practitioner

## 2018-05-28 VITALS — BP 129/74 | HR 80 | Temp 98.6°F | Ht 71.0 in | Wt 204.0 lb

## 2018-05-28 DIAGNOSIS — Z024 Encounter for examination for driving license: Secondary | ICD-10-CM

## 2018-05-28 LAB — URINALYSIS
Bilirubin, UA: NEGATIVE
Glucose, UA: NEGATIVE
Ketones, UA: NEGATIVE
Leukocytes, UA: NEGATIVE
Nitrite, UA: NEGATIVE
PH UA: 7 (ref 5.0–7.5)
Protein, UA: NEGATIVE
RBC, UA: NEGATIVE
Specific Gravity, UA: 1.015 (ref 1.005–1.030)
UUROB: 0.2 mg/dL (ref 0.2–1.0)

## 2018-05-28 NOTE — Progress Notes (Signed)
Patient ID: Spencer Stewart, male   DOB: Jun 24, 1956, 62 y.o.   MRN: [id number]  Private DOT- see scanned in documantation

## 2018-07-09 ENCOUNTER — Encounter: Payer: Self-pay | Admitting: Nurse Practitioner

## 2018-07-09 ENCOUNTER — Ambulatory Visit (INDEPENDENT_AMBULATORY_CARE_PROVIDER_SITE_OTHER): Payer: Self-pay | Admitting: Nurse Practitioner

## 2018-07-09 VITALS — BP 138/90 | HR 77 | Temp 97.8°F | Ht 71.0 in | Wt 205.0 lb

## 2018-07-09 DIAGNOSIS — J40 Bronchitis, not specified as acute or chronic: Secondary | ICD-10-CM

## 2018-07-09 MED ORDER — PREDNISONE 10 MG (21) PO TBPK: ORAL_TABLET | ORAL | 0 refills | Status: DC

## 2018-07-09 MED ORDER — BENZONATATE 100 MG PO CAPS: 100.0000 mg | ORAL_CAPSULE | Freq: Three times a day (TID) | ORAL | 0 refills | Status: DC | PRN

## 2018-07-09 NOTE — Progress Notes (Signed)
   Subjective:    Patient ID: Spencer Stewart, male    DOB: 04-01-1956, 63 y.o.   MRN: [id number]   Chief Complaint: Cough   HPI Patient come sin c/o cough that has been going on for 5 days. No better.    Review of Systems  Constitutional: Negative for chills and fever.  HENT: Negative for congestion, postnasal drip, rhinorrhea, sinus pressure, sinus pain, sore throat and trouble swallowing.   Respiratory: Positive for cough (productive and is worse at night).   Cardiovascular: Negative.   Gastrointestinal: Negative.   Neurological: Negative.   Psychiatric/Behavioral: Negative.   All other systems reviewed and are negative.      Objective:   Physical Exam Vitals signs and nursing note reviewed.  Constitutional:      General: He is not in acute distress.    Appearance: He is normal weight.  HENT:     Right Ear: Hearing, tympanic membrane, ear canal and external ear normal.     Left Ear: Hearing, tympanic membrane, ear canal and external ear normal.     Nose: Nose normal.     Mouth/Throat:     Lips: Pink.     Mouth: Mucous membranes are moist.     Pharynx: Oropharynx is clear. Uvula midline.  Neck:     Musculoskeletal: Normal range of motion.  Cardiovascular:     Rate and Rhythm: Normal rate and regular rhythm.     Pulses: Normal pulses.     Heart sounds: Normal heart sounds.  Pulmonary:     Effort: Pulmonary effort is normal.     Breath sounds: Normal breath sounds.     Comments: Barky cough Skin:    General: Skin is warm.  Neurological:     General: No focal deficit present.     Mental Status: He is alert.  Psychiatric:        Mood and Affect: Mood normal.        Behavior: Behavior normal.    BP 138/90   Pulse 77   Temp 97.8 F (36.6 C) (Oral)   Ht 5\' 11"  (1.803 m)   Wt 205 lb (93 kg)   BMI 28.59 kg/m      Assessment & Plan:  [name] in today with chief complaint of Cough and Nasal Congestion   1. Bronchitis Force  fluids Humidifier Rest rto office prn - predniSONE (STERAPRED UNI-PAK 21 TAB) 10 MG (21) TBPK tablet; As directed x 6 days  Dispense: 21 tablet; Refill: 0 - benzonatate (TESSALON PERLES) 100 MG capsule; Take 1 capsule (100 mg total) by mouth 3 (three) times daily as needed for cough.  Dispense: 20 capsule; Refill: 0   Spencer Daphine Deutscher, FNP

## 2018-07-09 NOTE — Patient Instructions (Signed)

## 2018-12-04 ENCOUNTER — Other Ambulatory Visit: Payer: Self-pay

## 2018-12-04 ENCOUNTER — Ambulatory Visit (INDEPENDENT_AMBULATORY_CARE_PROVIDER_SITE_OTHER): Payer: Self-pay | Admitting: Family Medicine

## 2018-12-04 VITALS — BP 126/71 | HR 71 | Temp 97.4°F | Ht 71.0 in | Wt 202.0 lb

## 2018-12-04 DIAGNOSIS — R791 Abnormal coagulation profile: Secondary | ICD-10-CM

## 2018-12-04 LAB — HEMOGLOBIN, FINGERSTICK: Hemoglobin: 17.4 g/dL (ref 12.6–17.7)

## 2018-12-04 NOTE — Progress Notes (Addendum)
Subjective: CC: shoulder bleeding PCP: Ernestina Penna, MD FVC:BSWH R Abbe is a 63 y.o. male presenting to clinic today for:  1. Shoulder bleed Patient reports that he had a lesion on the left shoulder that look like little sore.  He thought this was a little skin formation after he had had a little Brier in the left shoulder.  He was actually seen by his dermatologist on Wednesday who did not think it looked cancerous.  He was picking at it over the weekend and notes that it started bleeding and did not stop bleeding despite pressure, compression.  He in fact had a compression shirt on along with a bandage and soaked through the bandage insert overnight such that he soaked through his pillow.  He felt cold this morning but otherwise is not having any symptoms of anemia including heart palpitations, shortness of breath or fatigue.  No dizziness.  No history with bleeding in the past.  In fact he had several lesions taken off at his dermatologist and had no issues after that.   ROS: Per HPI  Allergies  Allergen Reactions  . Codeine   . Crestor [Rosuvastatin] Other (See Comments)    Causes myalgia   Past Medical History:  Diagnosis Date  . Hyperlipidemia     Current Outpatient Medications:  .  aspirin 81 MG chewable tablet, Chew by mouth daily., Disp: , Rfl:  .  Magnesium 400 MG TABS, Take by mouth., Disp: , Rfl:  .  testosterone cypionate (DEPOTESTOSTERONE CYPIONATE) 200 MG/ML injection, INJECT IM EVERY 14 DAYS, Disp: 10 mL, Rfl: 1 .  UNABLE TO FIND, Med Name: flax seed , vit d, zinc and magnesium and BCAA branch-chain amino acids, Disp: , Rfl:  .  Somatropin (GENOTROPIN) 0.2 MG SOLR, Inject 0.2 mg into the skin daily., Disp: 58.5 each, Rfl: 5 Social History   Socioeconomic History  . Marital status: Married    Spouse name: Not on file  . Number of children: Not on file  . Years of education: Not on file  . Highest education level: Not on file  Occupational History  .  Not on file  Social Needs  . Financial resource strain: Not on file  . Food insecurity:    Worry: Not on file    Inability: Not on file  . Transportation needs:    Medical: Not on file    Non-medical: Not on file  Tobacco Use  . Smoking status: Former Games developer  . Smokeless tobacco: Former Engineer, water and Sexual Activity  . Alcohol use: Yes    Comment: twice weekly  . Drug use: No  . Sexual activity: Not on file  Lifestyle  . Physical activity:    Days per week: Not on file    Minutes per session: Not on file  . Stress: Not on file  Relationships  . Social connections:    Talks on phone: Not on file    Gets together: Not on file    Attends religious service: Not on file    Active member of club or organization: Not on file    Attends meetings of clubs or organizations: Not on file    Relationship status: Not on file  . Intimate partner violence:    Fear of current or ex partner: Not on file    Emotionally abused: Not on file    Physically abused: Not on file    Forced sexual activity: Not on file  Other Topics Concern  .  Not on file  Social History Narrative  . Not on file   Family History  Problem Relation Age of Onset  . Stroke Father   . Colon cancer Cousin     Objective: Office vital signs reviewed. BP 126/71   Pulse 71   Temp (!) 97.4 F (36.3 C) (Oral)   Ht 5\' 11"  (1.803 m)   Wt 202 lb (91.6 kg)   BMI 28.17 kg/m   Physical Examination:  General: Awake, alert, well nourished, well appearing. No acute distress HEENT: Normal, sclera white.  No conjunctival pallor Skin: 1 mm area of skin breakdown along the left anterior shoulder with slow oozing of blood noted.  Procedure: Cautery for bleed.  Verbal consent obtained.  Informed consent provided.  Cautery pen used to achieve hemostasis of left anterior shoulder bleed.  Hemostasis achieved after 1 application.  Assessment/ Plan: 63 y.o. male   1. Abnormal bleeding time Hemodynamically stable.   Lesion on shoulder was likely over a vessel which is why it continues to bleed.  I applied silver nitrate to the affected area initially but this did not achieve hemostasis and therefore cautery was used.  Hemostasis was achieved after 1 application of cautery.  Because patient had had such significant bleeding at home check fingerstick hemoglobin.  Though anticipate he will do fine now that we have achieved hemostasis.  Home care instructions were reviewed with the patient.  Reasons return discussed.  He will follow-up PRN. - Hemoglobin, fingerstick   No orders of the defined types were placed in this encounter.  No orders of the defined types were placed in this encounter.    Raliegh IpAshly M Maverick Patman, DO [facility] [phone]

## 2018-12-29 ENCOUNTER — Other Ambulatory Visit: Payer: Self-pay | Admitting: Physician Assistant

## 2018-12-31 ENCOUNTER — Other Ambulatory Visit: Payer: Self-pay | Admitting: Physician Assistant

## 2018-12-31 DIAGNOSIS — E349 Endocrine disorder, unspecified: Secondary | ICD-10-CM

## 2019-01-02 ENCOUNTER — Telehealth: Payer: Self-pay | Admitting: Family Medicine

## 2019-01-02 ENCOUNTER — Other Ambulatory Visit: Payer: Self-pay | Admitting: Family Medicine

## 2019-01-02 NOTE — Telephone Encounter (Signed)
Pt's shoulder has started bleeding again Please advise

## 2019-01-02 NOTE — Telephone Encounter (Signed)
Will refer to dermatology

## 2019-01-02 NOTE — Telephone Encounter (Signed)
He has a dermatologist, that I would recommend rechecking lesion.  He can come in for treatment again if he'd like.

## 2019-01-02 NOTE — Telephone Encounter (Signed)
appt scheduled Pt notified 

## 2019-01-03 ENCOUNTER — Ambulatory Visit: Payer: Self-pay | Admitting: Family Medicine

## 2019-01-14 ENCOUNTER — Other Ambulatory Visit: Payer: Self-pay | Admitting: Physician Assistant

## 2019-01-14 DIAGNOSIS — E349 Endocrine disorder, unspecified: Secondary | ICD-10-CM

## 2019-02-25 ENCOUNTER — Other Ambulatory Visit: Payer: Self-pay

## 2019-02-26 ENCOUNTER — Ambulatory Visit (INDEPENDENT_AMBULATORY_CARE_PROVIDER_SITE_OTHER): Payer: Self-pay | Admitting: Family

## 2019-02-26 ENCOUNTER — Encounter: Payer: Self-pay | Admitting: Family

## 2019-02-26 VITALS — BP 128/83 | HR 72 | Temp 98.4°F | Ht 71.0 in | Wt 199.2 lb

## 2019-02-26 DIAGNOSIS — J301 Allergic rhinitis due to pollen: Secondary | ICD-10-CM

## 2019-02-26 DIAGNOSIS — H938X2 Other specified disorders of left ear: Secondary | ICD-10-CM

## 2019-02-26 MED ORDER — FLUTICASONE PROPIONATE 50 MCG/ACT NA SUSP: 2.0000 | Freq: Every day | NASAL | 6 refills | Status: AC

## 2019-02-26 MED ORDER — CETIRIZINE HCL 10 MG PO TABS: 10.0000 mg | ORAL_TABLET | Freq: Every day | ORAL | 11 refills | Status: AC

## 2019-02-26 NOTE — Patient Instructions (Signed)

## 2019-02-26 NOTE — Progress Notes (Signed)
Subjective:    Patient ID: Spencer Stewart, male    DOB: 08-28-1955, 63 y.o.   MRN: [id number]  Chief Complaint  Patient presents with  . left ear decreased hearing    Ear Fullness  There is pain in the left ear. This is a new problem. The current episode started in the past 7 days. The problem occurs constantly. There has been no fever. The pain is at a severity of 0/10. The patient is experiencing no pain. Associated symptoms include hearing loss. Pertinent negatives include no coughing, diarrhea, ear discharge, headaches, rhinorrhea or sore throat. He has tried ear drops for the symptoms. The treatment provided no relief.      Review of Systems  HENT: Positive for hearing loss. Negative for ear discharge, rhinorrhea and sore throat.   Respiratory: Negative for cough.   Gastrointestinal: Negative for diarrhea.  Neurological: Negative for headaches.  All other systems reviewed and are negative.      Objective:   Physical Exam Vitals signs reviewed.  Constitutional:      General: He is not in acute distress.    Appearance: He is well-developed.  HENT:     Head: Normocephalic.     Right Ear: Tympanic membrane normal.     Left Ear: Tympanic membrane normal.     Nose: Mucosal edema present.     Mouth/Throat:     Pharynx: Posterior oropharyngeal erythema present.  Eyes:     General:        Right eye: No discharge.        Left eye: No discharge.     Pupils: Pupils are equal, round, and reactive to light.  Neck:     Musculoskeletal: Normal range of motion and neck supple.     Thyroid: No thyromegaly.  Cardiovascular:     Rate and Rhythm: Normal rate and regular rhythm.     Heart sounds: Normal heart sounds. No murmur.  Pulmonary:     Effort: Pulmonary effort is normal. No respiratory distress.     Breath sounds: Normal breath sounds. No wheezing.  Abdominal:     General: Bowel sounds are normal. There is no distension.     Palpations: Abdomen is soft.     Tenderness:  There is no abdominal tenderness.  Musculoskeletal: Normal range of motion.        General: No tenderness.  Skin:    General: Skin is warm and dry.     Findings: No erythema or rash.  Neurological:     Mental Status: He is alert and oriented to person, place, and time.     Cranial Nerves: No cranial nerve deficit.     Deep Tendon Reflexes: Reflexes are normal and symmetric.  Psychiatric:        Behavior: Behavior normal.        Thought Content: Thought content normal.        Judgment: Judgment normal.       BP 128/83   Pulse 72   Temp 98.4 F (36.9 C) (Oral)   Ht 5\' 11"  (1.803 m)   Wt 199 lb 3.2 oz (90.4 kg)   BMI 27.78 kg/m      Assessment & Plan:  Spencer Stewart comes in today with chief complaint of left ear decreased hearing   Diagnosis and orders addressed:  1. Sensation of fullness in left ear - cetirizine (ZYRTEC) 10 MG tablet; Take 1 tablet (10 mg total) by mouth daily.  Dispense: 30 tablet; Refill:  11 - fluticasone (FLONASE) 50 MCG/ACT nasal spray; Place 2 sprays into both nostrils daily.  Dispense: 16 g; Refill: 6  2. Allergic rhinitis due to pollen, unspecified seasonality - cetirizine (ZYRTEC) 10 MG tablet; Take 1 tablet (10 mg total) by mouth daily.  Dispense: 30 tablet; Refill: 11 - fluticasone (FLONASE) 50 MCG/ACT nasal spray; Place 2 sprays into both nostrils daily.  Dispense: 16 g; Refill: 6  I believe this is related to allergies. We will start Flonase and Zyrtec daily. Continue to wear mask while mowing yard. If no improvement in the next 2 weeks will do ENT referral.    Jannifer Rodneyhristy Hawks, FNP

## 2019-03-01 ENCOUNTER — Other Ambulatory Visit: Payer: Self-pay

## 2019-03-04 ENCOUNTER — Encounter: Payer: Self-pay | Admitting: Physician Assistant

## 2019-03-04 ENCOUNTER — Other Ambulatory Visit: Payer: Self-pay

## 2019-03-04 ENCOUNTER — Ambulatory Visit (INDEPENDENT_AMBULATORY_CARE_PROVIDER_SITE_OTHER): Payer: Self-pay | Admitting: Physician Assistant

## 2019-03-04 ENCOUNTER — Ambulatory Visit (INDEPENDENT_AMBULATORY_CARE_PROVIDER_SITE_OTHER): Payer: Self-pay

## 2019-03-04 VITALS — BP 138/80 | HR 80 | Temp 98.4°F | Ht 71.0 in | Wt 202.4 lb

## 2019-03-04 DIAGNOSIS — M25551 Pain in right hip: Secondary | ICD-10-CM

## 2019-03-04 DIAGNOSIS — E349 Endocrine disorder, unspecified: Secondary | ICD-10-CM

## 2019-03-04 DIAGNOSIS — Z0000 Encounter for general adult medical examination without abnormal findings: Secondary | ICD-10-CM

## 2019-03-04 MED ORDER — GENOTROPIN MINIQUICK 0.2 MG ~~LOC~~ SOLR: SUBCUTANEOUS | 5 refills | Status: DC

## 2019-03-04 MED ORDER — TESTOSTERONE CYPIONATE 200 MG/ML IM SOLN: INTRAMUSCULAR | 5 refills | Status: DC

## 2019-03-04 NOTE — Patient Instructions (Signed)
Hip Exercises Ask your health care provider which exercises are safe for you. Do exercises exactly as told by your health care provider and adjust them as directed. It is normal to feel mild stretching, pulling, tightness, or discomfort as you do these exercises. Stop right away if you feel sudden pain or your pain gets worse. Do not begin these exercises until told by your health care provider. Stretching and range-of-motion exercises These exercises warm up your muscles and joints and improve the movement and flexibility of your hip. These exercises also help to relieve pain, numbness, and tingling. You may be asked to limit your range of motion if you had a hip replacement. Talk to your health care provider about these restrictions. Hamstrings, supine  1. Lie on your back (supine position). 2. Loop a belt or towel over the ball of your left / right foot. The ball of your foot is on the walking surface, right under your toes. 3. Straighten your left / right knee and slowly pull on the belt or towel to raise your leg until you feel a gentle stretch behind your knee (hamstring). ? Do not let your knee bend while you do this. ? Keep your other leg flat on the floor. 4. Hold this position for __________ seconds. 5. Slowly return your leg to the starting position. Repeat __________ times. Complete this exercise __________ times a day. Hip rotation  1. Lie on your back on a firm surface. 2. With your left / right hand, gently pull your left / right knee toward the shoulder that is on the same side of the body. Stop when your knee is pointing toward the ceiling. 3. Hold your left / right ankle with your other hand. 4. Keeping your knee steady, gently pull your left / right ankle toward your other shoulder until you feel a stretch in your buttocks. ? Keep your hips and shoulders firmly planted while you do this stretch. 5. Hold this position for __________ seconds. Repeat __________ times. Complete  this exercise __________ times a day. Seated stretch This exercise is sometimes called hamstrings and adductors stretch. 1. Sit on the floor with your legs stretched wide. Keep your knees straight during this exercise. 2. Keeping your head and back in a straight line, bend at your waist to reach for your left foot (position A). You should feel a stretch in your right inner thigh (adductors). 3. Hold this position for __________ seconds. Then slowly return to the upright position. 4. Keeping your head and back in a straight line, bend at your waist to reach forward (position B). You should feel a stretch behind both of your thighs and knees (hamstrings). 5. Hold this position for __________ seconds. Then slowly return to the upright position. 6. Keeping your head and back in a straight line, bend at your waist to reach for your right foot (position C). You should feel a stretch in your left inner thigh (adductors). 7. Hold this position for __________ seconds. Then slowly return to the upright position. Repeat __________ times. Complete this exercise __________ times a day. Lunge This exercise stretches the muscles of the hip (hip flexors). 1. Place your left / right knee on the floor and bend your other knee so that is directly over your ankle. You should be half-kneeling. 2. Keep good posture with your head over your shoulders. 3. Tighten your buttocks to point your tailbone downward. This will prevent your back from arching too much. 4. You should feel a   gentle stretch in the front of your left / right thigh and hip. If you do not feel a stretch, slide your other foot forward slightly and then slowly lunge forward with your chest up until your knee once again lines up over your ankle. ? Make sure your tailbone continues to point downward. 5. Hold this position for __________ seconds. 6. Slowly return to the starting position. Repeat __________ times. Complete this exercise __________ times a  day. Strengthening exercises These exercises build strength and endurance in your hip. Endurance is the ability to use your muscles for a long time, even after they get tired. Bridge This exercise strengthens the muscles of your hip (hip extensors). 1. Lie on your back on a firm surface with your knees bent and your feet flat on the floor. 2. Tighten your buttocks muscles and lift your bottom off the floor until the trunk of your body and your hips are level with your thighs. ? Do not arch your back. ? You should feel the muscles working in your buttocks and the back of your thighs. If you do not feel these muscles, slide your feet 1-2 inches (2.5-5 cm) farther away from your buttocks. 3. Hold this position for __________ seconds. 4. Slowly lower your hips to the starting position. 5. Let your muscles relax completely between repetitions. Repeat __________ times. Complete this exercise __________ times a day. Straight leg raises, side-lying This exercise strengthens the muscles that move the hip joint away from the center of the body (hip abductors). 1. Lie on your side with your left / right leg in the top position. Lie so your head, shoulder, hip, and knee line up. You may bend your bottom knee slightly to help you balance. 2. Roll your hips slightly forward, so your hips are stacked directly over each other and your left / right knee is facing forward. 3. Leading with your heel, lift your top leg 4-6 inches (10-15 cm). You should feel the muscles in your top hip lifting. ? Do not let your foot drift forward. ? Do not let your knee roll toward the ceiling. 4. Hold this position for __________ seconds. 5. Slowly return to the starting position. 6. Let your muscles relax completely between repetitions. Repeat __________ times. Complete this exercise __________ times a day. Straight leg raises, side-lying This exercise strengthen the muscles that move the hip joint toward the center of the  body (hip adductors). 1. Lie on your side with your left / right leg in the bottom position. Lie so your head, shoulder, hip, and knee line up. You may place your upper foot in front to help you balance. 2. Roll your hips slightly forward, so your hips are stacked directly over each other and your left / right knee is facing forward. 3. Tense the muscles in your inner thigh and lift your bottom leg 4-6 inches (10-15 cm). 4. Hold this position for __________ seconds. 5. Slowly return to the starting position. 6. Let your muscles relax completely between repetitions. Repeat __________ times. Complete this exercise __________ times a day. Straight leg raises, supine This exercise strengthens the muscles in the front of your thigh (quadriceps). 1. Lie on your back (supine position) with your left / right leg extended and your other knee bent. 2. Tense the muscles in the front of your left / right thigh. You should see your kneecap slide up or see increased dimpling just above your knee. 3. Keep these muscles tight as you raise your   leg 4-6 inches (10-15 cm) off the floor. Do not let your knee bend. 4. Hold this position for __________ seconds. 5. Keep these muscles tense as you lower your leg. 6. Relax the muscles slowly and completely between repetitions. Repeat __________ times. Complete this exercise __________ times a day. Hip abductors, standing This exercise strengthens the muscles that move the leg and hip joint away from the center of the body (hip abductors). 1. Tie one end of a rubber exercise band or tubing to a secure surface, such as a chair, table, or pole. 2. Loop the other end of the band or tubing around your left / right ankle. 3. Keeping your ankle with the band or tubing directly opposite the secured end, step away until there is tension in the tubing or band. Hold on to a chair, table, or pole as needed for balance. 4. Lift your left / right leg out to your side. While you do  this: ? Keep your back upright. ? Keep your shoulders over your hips. ? Keep your toes pointing forward. ? Make sure to use your hip muscles to slowly lift your leg. Do not tip your body or forcefully lift your leg. 5. Hold this position for __________ seconds. 6. Slowly return to the starting position. Repeat __________ times. Complete this exercise __________ times a day. Squats This exercise strengthens the muscles in the front of your thigh (quadriceps). 1. Stand in a door frame so your feet and knees are in line with the frame. You may place your hands on the frame for balance. 2. Slowly bend your knees and lower your hips like you are going to sit in a chair. ? Keep your lower legs in a straight-up-and-down position. ? Do not let your hips go lower than your knees. ? Do not bend your knees lower than told by your health care provider. ? If your hip pain increases, do not bend as low. 3. Hold this position for ___________ seconds. 4. Slowly push with your legs to return to standing. Do not use your hands to pull yourself to standing. Repeat __________ times. Complete this exercise __________ times a day. This information is not intended to replace advice given to you by your health care provider. Make sure you discuss any questions you have with your health care provider. Document Released: 07/08/2005 Document Revised: 05/01/2018 Document Reviewed: 05/01/2018 Elsevier Patient Education  2020 [employer]  

## 2019-03-05 LAB — CBC WITH DIFFERENTIAL/PLATELET
Basophils Absolute: 0.1 10*3/uL (ref 0.0–0.2)
Basos: 1 %
EOS (ABSOLUTE): 0.1 10*3/uL (ref 0.0–0.4)
Eos: 2 %
Hematocrit: 47.2 % (ref 37.5–51.0)
Hemoglobin: 15.9 g/dL (ref 13.0–17.7)
Immature Grans (Abs): 0 10*3/uL (ref 0.0–0.1)
Immature Granulocytes: 0 %
Lymphocytes Absolute: 1.6 10*3/uL (ref 0.7–3.1)
Lymphs: 27 %
MCH: 29.9 pg (ref 26.6–33.0)
MCHC: 33.7 g/dL (ref 31.5–35.7)
MCV: 89 fL (ref 79–97)
Monocytes Absolute: 0.7 10*3/uL (ref 0.1–0.9)
Monocytes: 11 %
Neutrophils Absolute: 3.5 10*3/uL (ref 1.4–7.0)
Neutrophils: 59 %
Platelets: 204 10*3/uL (ref 150–450)
RBC: 5.31 x10E6/uL (ref 4.14–5.80)
RDW: 13.1 % (ref 11.6–15.4)
WBC: 5.9 10*3/uL (ref 3.4–10.8)

## 2019-03-05 LAB — CMP14+EGFR
ALT: 50 IU/L — ABNORMAL HIGH (ref 0–44)
AST: 45 IU/L — ABNORMAL HIGH (ref 0–40)
Albumin/Globulin Ratio: 1.9 (ref 1.2–2.2)
Albumin: 4.1 g/dL (ref 3.8–4.8)
Alkaline Phosphatase: 101 IU/L (ref 39–117)
BUN/Creatinine Ratio: 19 (ref 10–24)
BUN: 20 mg/dL (ref 8–27)
Bilirubin Total: 0.4 mg/dL (ref 0.0–1.2)
CO2: 23 mmol/L (ref 20–29)
Calcium: 9.1 mg/dL (ref 8.6–10.2)
Chloride: 101 mmol/L (ref 96–106)
Creatinine, Ser: 1.08 mg/dL (ref 0.76–1.27)
GFR calc Af Amer: 84 mL/min/{1.73_m2} (ref >59)
GFR calc non Af Amer: 73 mL/min/{1.73_m2} (ref >59)
Globulin, Total: 2.2 g/dL (ref 1.5–4.5)
Glucose: 152 mg/dL — ABNORMAL HIGH (ref 65–99)
Potassium: 4.5 mmol/L (ref 3.5–5.2)
Sodium: 141 mmol/L (ref 134–144)
Total Protein: 6.3 g/dL (ref 6.0–8.5)

## 2019-03-05 LAB — LIPID PANEL
Chol/HDL Ratio: 4.6 ratio (ref 0.0–5.0)
Cholesterol, Total: 165 mg/dL (ref 100–199)
HDL: 36 mg/dL — ABNORMAL LOW (ref >39)
LDL Chol Calc (NIH): 114 mg/dL — ABNORMAL HIGH (ref 0–99)
Triglycerides: 80 mg/dL (ref 0–149)
VLDL Cholesterol Cal: 15 mg/dL (ref 5–40)

## 2019-03-05 LAB — PSA: Prostate Specific Ag, Serum: 0.7 ng/mL (ref 0.0–4.0)

## 2019-03-11 NOTE — Progress Notes (Signed)
BP 138/80   Pulse 80   Temp 98.4 F (36.9 C) (Temporal)   Ht '5\' 11"'  (1.803 m)   Wt 202 lb 6.4 oz (91.8 kg)   BMI 28.23 kg/m    Subjective:    Patient ID: Spencer Stewart, male    DOB: 10-20-1955, 63 y.o.   MRN: [id number]  HPI: Spencer Stewart is a 63 y.o. male presenting on 03/04/2019 for Medical Management of Chronic Issues  This patient comes in for recheck on his chronic medical conditions which do include allergies, hypogonadism.  He has done very well with his testosterone and somatropin.  Labs will be for performed today.  He is having some difficulty with his right hip and groin.  Also in the lumbar area over to the left.  Many years ago he had an injury to this area he noticed this is added to why he is very tight in this area.  An x-ray will be performed.  We can consider physical therapy or referral to orthopedics if needed.  Past Medical History:  Diagnosis Date  . Hyperlipidemia    Relevant past medical, surgical, family and social history reviewed and updated as indicated. Interim medical history since our last visit reviewed. Allergies and medications reviewed and updated. DATA REVIEWED: CHART IN EPIC  Family History reviewed for pertinent findings.  Review of Systems  Constitutional: Negative.  Negative for appetite change and fatigue.  Eyes: Negative for pain and visual disturbance.  Respiratory: Negative.  Negative for cough, chest tightness, shortness of breath and wheezing.   Cardiovascular: Negative.  Negative for chest pain, palpitations and leg swelling.  Gastrointestinal: Negative.  Negative for abdominal pain, diarrhea, nausea and vomiting.  Genitourinary: Negative.   Musculoskeletal: Positive for arthralgias and back pain.  Skin: Negative.  Negative for color change and rash.  Neurological: Negative.  Negative for weakness, numbness and headaches.  Psychiatric/Behavioral: Negative.     Allergies as of 03/04/2019      Reactions   Codeine    Crestor  [rosuvastatin] Other (See Comments)   Causes myalgia      Medication List       Accurate as of March 04, 2019 11:59 PM. If you have any questions, ask your nurse or doctor.        aspirin 81 MG chewable tablet Chew by mouth daily.   cetirizine 10 MG tablet Commonly known as: ZYRTEC Take 1 tablet (10 mg total) by mouth daily.   fluticasone 50 MCG/ACT nasal spray Commonly known as: FLONASE Place 2 sprays into both nostrils daily.   GENOTROPIN 0.2 MG Solr Generic drug: Somatropin Take as directed What changed: additional instructions Changed by: Terald Sleeper, PA-C   Magnesium 400 MG Tabs Take by mouth.   testosterone cypionate 200 MG/ML injection Commonly known as: DEPOTESTOSTERONE CYPIONATE INJECT 1ML IM EVERY 14 DAYS. Needs annual check and labs   UNABLE TO FIND Med Name: flax seed , vit d, zinc and magnesium and BCAA branch-chain amino acids          Objective:    BP 138/80   Pulse 80   Temp 98.4 F (36.9 C) (Temporal)   Ht '5\' 11"'  (1.803 m)   Wt 202 lb 6.4 oz (91.8 kg)   BMI 28.23 kg/m   Allergies  Allergen Reactions  . Codeine   . Crestor [Rosuvastatin] Other (See Comments)    Causes myalgia    Wt Readings from Last 3 Encounters:  03/04/19 202 lb 6.4 oz (  91.8 kg)  02/26/19 199 lb 3.2 oz (90.4 kg)  12/04/18 202 lb (91.6 kg)    Physical Exam Vitals signs and nursing note reviewed.  Constitutional:      General: He is not in acute distress.    Appearance: He is well-developed.  HENT:     Head: Normocephalic and atraumatic.  Eyes:     Conjunctiva/sclera: Conjunctivae normal.     Pupils: Pupils are equal, round, and reactive to light.  Cardiovascular:     Rate and Rhythm: Normal rate and regular rhythm.     Heart sounds: Normal heart sounds.  Pulmonary:     Effort: Pulmonary effort is normal. No respiratory distress.     Breath sounds: Normal breath sounds.  Musculoskeletal:     Right hip: He exhibits decreased range of motion and  decreased strength. He exhibits no deformity.     Lumbar back: He exhibits decreased range of motion, tenderness and pain.       Legs:  Skin:    General: Skin is warm and dry.  Psychiatric:        Behavior: Behavior normal.     Results for orders placed or performed in visit on 03/04/19  CBC with Differential/Platelet  Result Value Ref Range   WBC 5.9 3.4 - 10.8 x10E3/uL   RBC 5.31 4.14 - 5.80 x10E6/uL   Hemoglobin 15.9 13.0 - 17.7 g/dL   Hematocrit 47.2 37.5 - 51.0 %   MCV 89 79 - 97 fL   MCH 29.9 26.6 - 33.0 pg   MCHC 33.7 31.5 - 35.7 g/dL   RDW 13.1 11.6 - 15.4 %   Platelets 204 150 - 450 x10E3/uL   Neutrophils 59 Not Estab. %   Lymphs 27 Not Estab. %   Monocytes 11 Not Estab. %   Eos 2 Not Estab. %   Basos 1 Not Estab. %   Neutrophils Absolute 3.5 1.4 - 7.0 x10E3/uL   Lymphocytes Absolute 1.6 0.7 - 3.1 x10E3/uL   Monocytes Absolute 0.7 0.1 - 0.9 x10E3/uL   EOS (ABSOLUTE) 0.1 0.0 - 0.4 x10E3/uL   Basophils Absolute 0.1 0.0 - 0.2 x10E3/uL   Immature Granulocytes 0 Not Estab. %   Immature Grans (Abs) 0.0 0.0 - 0.1 x10E3/uL  CMP14+EGFR  Result Value Ref Range   Glucose 152 (H) 65 - 99 mg/dL   BUN 20 8 - 27 mg/dL   Creatinine, Ser 1.08 0.76 - 1.27 mg/dL   GFR calc non Af Amer 73 >59 mL/min/1.73   GFR calc Af Amer 84 >59 mL/min/1.73   BUN/Creatinine Ratio 19 10 - 24   Sodium 141 134 - 144 mmol/L   Potassium 4.5 3.5 - 5.2 mmol/L   Chloride 101 96 - 106 mmol/L   CO2 23 20 - 29 mmol/L   Calcium 9.1 8.6 - 10.2 mg/dL   Total Protein 6.3 6.0 - 8.5 g/dL   Albumin 4.1 3.8 - 4.8 g/dL   Globulin, Total 2.2 1.5 - 4.5 g/dL   Albumin/Globulin Ratio 1.9 1.2 - 2.2   Bilirubin Total 0.4 0.0 - 1.2 mg/dL   Alkaline Phosphatase 101 39 - 117 IU/L   AST 45 (H) 0 - 40 IU/L   ALT 50 (H) 0 - 44 IU/L  Lipid panel  Result Value Ref Range   Cholesterol, Total 165 100 - 199 mg/dL   Triglycerides 80 0 - 149 mg/dL   HDL 36 (L) >39 mg/dL   VLDL Cholesterol Cal 15 5 - 40 mg/dL  LDL Chol  Calc (NIH) 114 (H) 0 - 99 mg/dL   Lipid Comment: CANCELED    Chol/HDL Ratio 4.6 0.0 - 5.0 ratio  PSA  Result Value Ref Range   Prostate Specific Ag, Serum 0.7 0.0 - 4.0 ng/mL      Assessment & Plan:   1. Hypotestosteronemia - testosterone cypionate (DEPOTESTOSTERONE CYPIONATE) 200 MG/ML injection; INJECT 1ML IM EVERY 14 DAYS. Needs annual check and labs  Dispense: 10 mL; Refill: 5 - Somatropin (GENOTROPIN) 0.2 MG SOLR; Take as directed  Dispense: 56 each; Refill: 5  2. Well adult exam - CBC with Differential/Platelet - CMP14+EGFR - Lipid panel - PSA  3. Right hip pain - DG Hip Unilat W OR W/O Pelvis 2-3 Views Right; Future   Continue all other maintenance medications as listed above.  Follow up plan: No follow-ups on file.  Educational handout given for hip exercises  Terald Sleeper PA-C [place] [address]  [place], [place] [address] [phone]   03/11/2019, 8:32 PM

## 2019-06-26 ENCOUNTER — Ambulatory Visit: Payer: HRSA Program | Attending: Internal Medicine

## 2019-06-26 ENCOUNTER — Other Ambulatory Visit: Payer: Self-pay

## 2019-06-26 DIAGNOSIS — Z20828 Contact with and (suspected) exposure to other viral communicable diseases: Secondary | ICD-10-CM | POA: Diagnosis present

## 2019-06-26 DIAGNOSIS — Z20822 Contact with and (suspected) exposure to covid-19: Secondary | ICD-10-CM

## 2019-06-26 DIAGNOSIS — U071 COVID-19: Secondary | ICD-10-CM | POA: Diagnosis not present

## 2019-06-28 LAB — NOVEL CORONAVIRUS, NAA: SARS-CoV-2, NAA: DETECTED — AB

## 2019-07-10 IMAGING — MR MR LUMBAR SPINE W/O CM
4 of 5 series · 25 of 48 positions shown · non-contrast
Comparison: None.

CLINICAL DATA: Low back pain for 8 months. Radiating pain into both
hips and posterior thighs, left greater than right. Left leg
weakness.

EXAM:
MRI LUMBAR SPINE WITHOUT CONTRAST
TECHNIQUE: Multiplanar, multisequence MR imaging of the lumbar spine was
performed. No intravenous contrast was administered.

[Series 2: T1 · sagittal · 4.0mm · 0.81mm/px · 6 of 16 slices shown (1 of 2)]
[im 1/16]
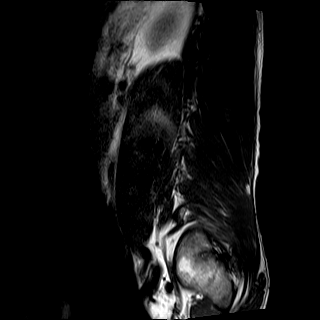
[im 4/16]
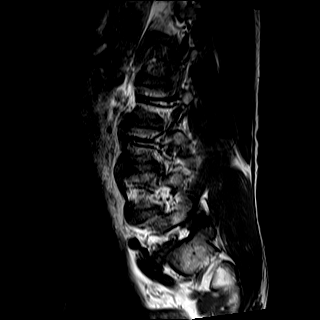
[im 7/16]
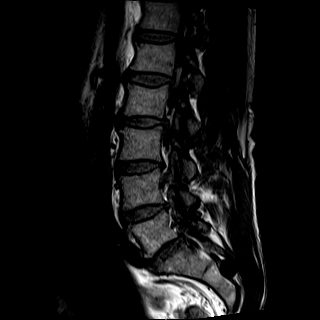
[im 10/16]
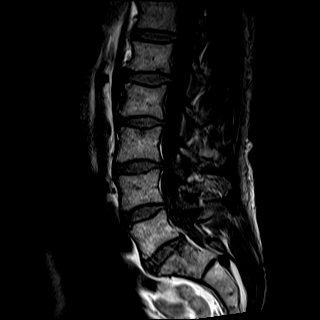
[im 13/16]
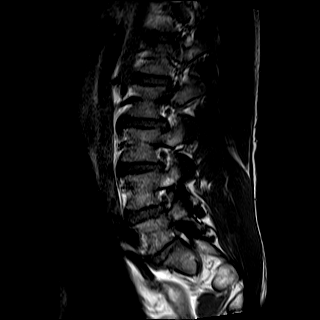
[im 16/16]
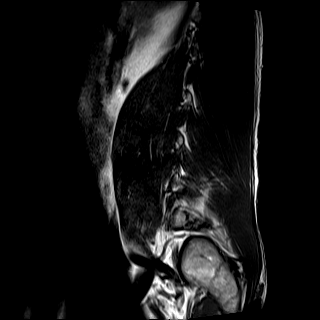

[Series 3: T2 · sagittal · 4.0mm · 0.81mm/px · 6 of 16 slices shown (1 of 2)]
[im 1/16]
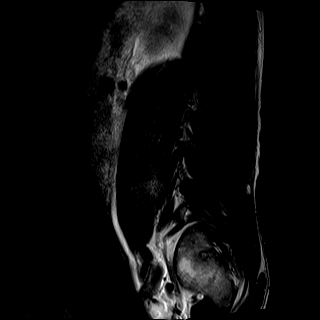
[im 4/16]
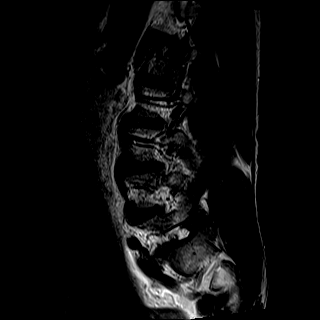
[im 7/16]
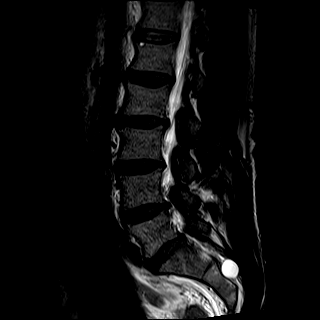
[im 10/16]
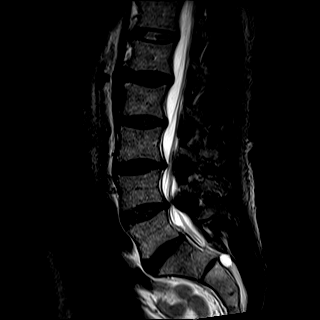
[im 13/16]
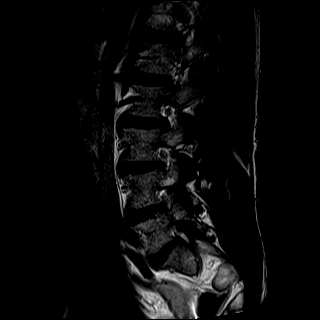
[im 16/16]
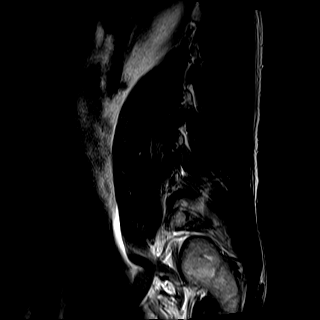

[Series 5: T2 · axial · 4.0mm · 0.39mm/px · z∈[-144,+40]mm · 9 of 38 slices shown (2 of 2)]
[im 1/38]
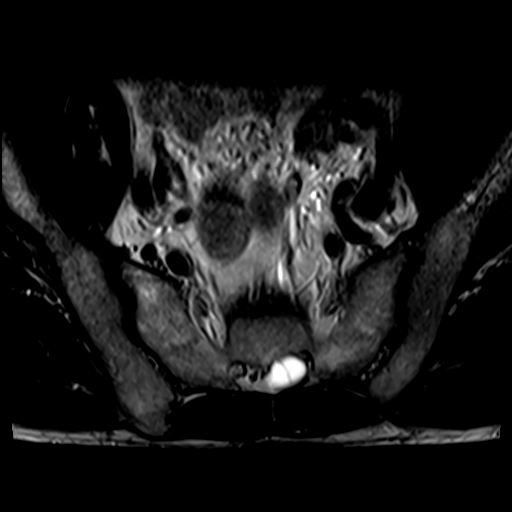
[im 6/38]
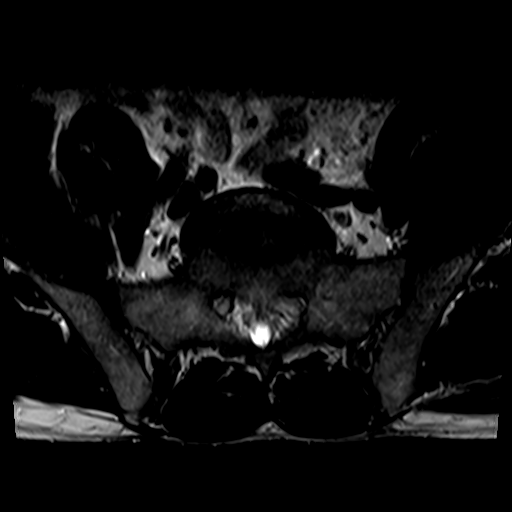
[im 11/38]
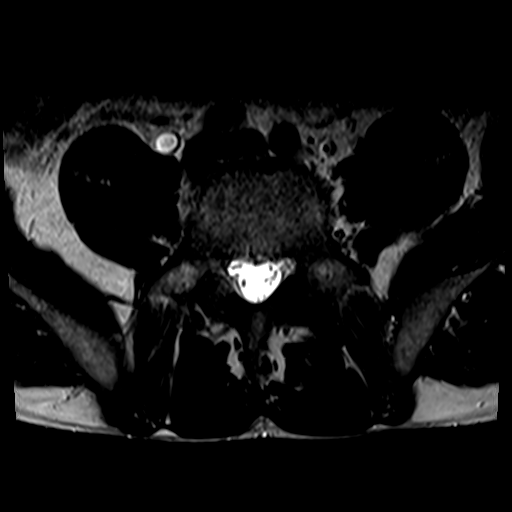
[im 16/38]
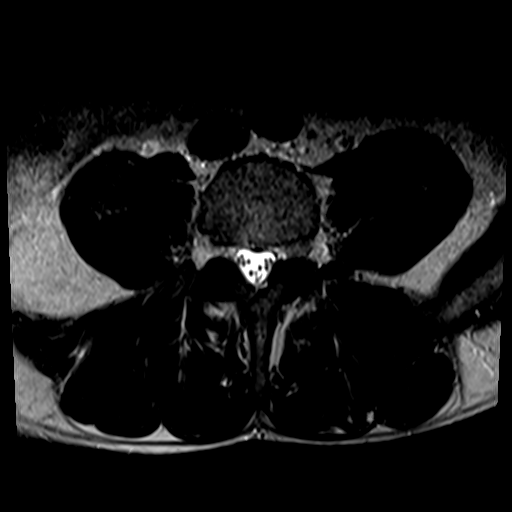
[im 19/38]
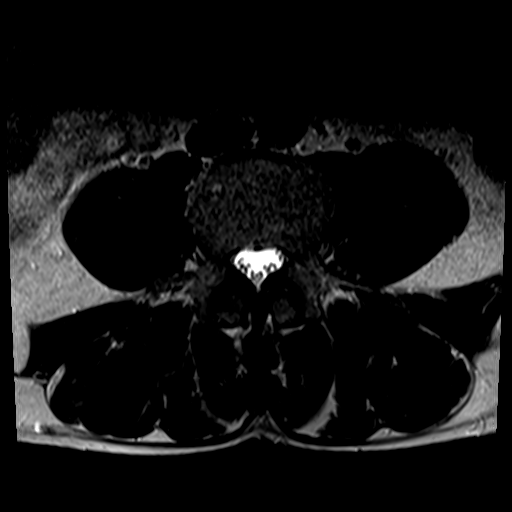
[im 22/38]
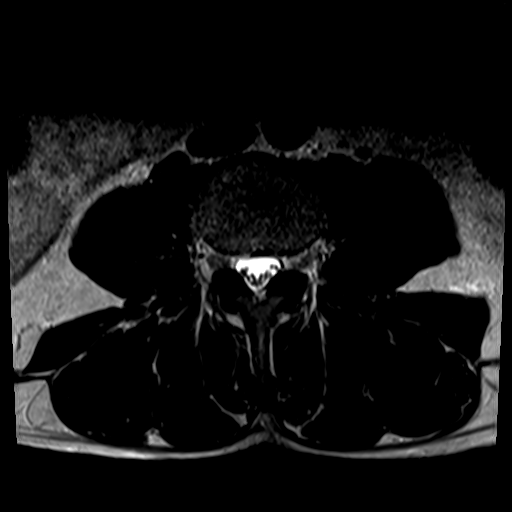
[im 27/38]
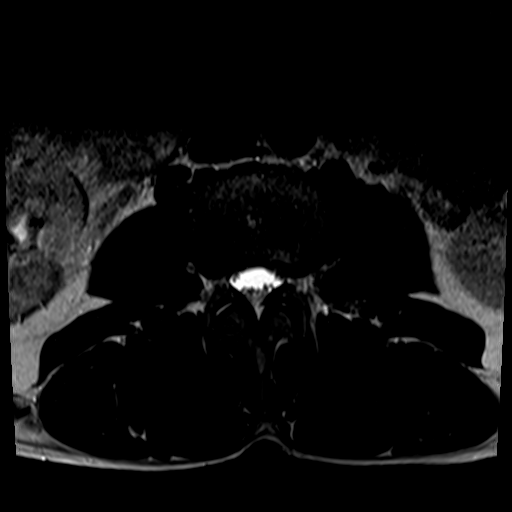
[im 32/38]
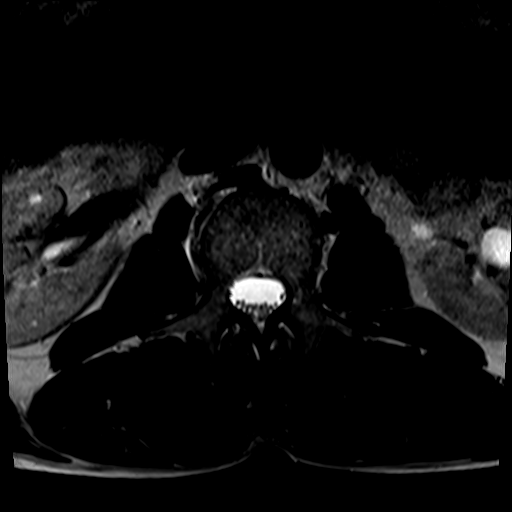
[im 38/38]
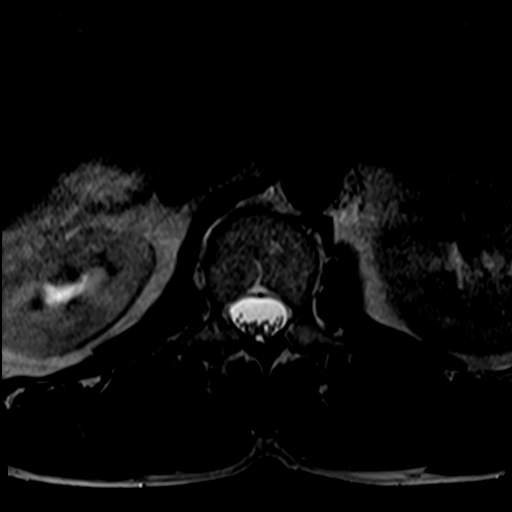

[Series 6: T1 · axial · 4.0mm · 0.39mm/px · z∈[-144,+10]mm · 4 of 38 slices shown (2 of 2)]
[im 1/38]
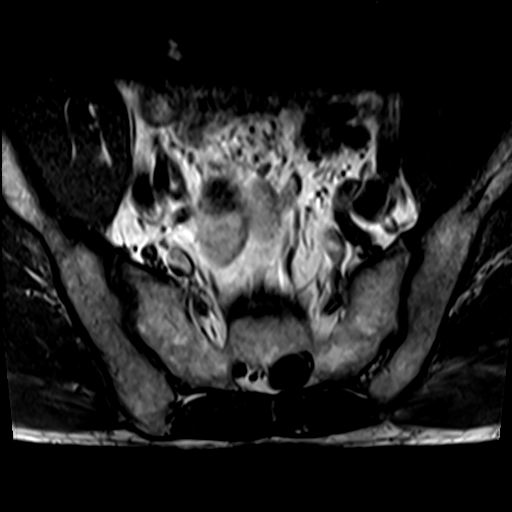
[im 6/38]
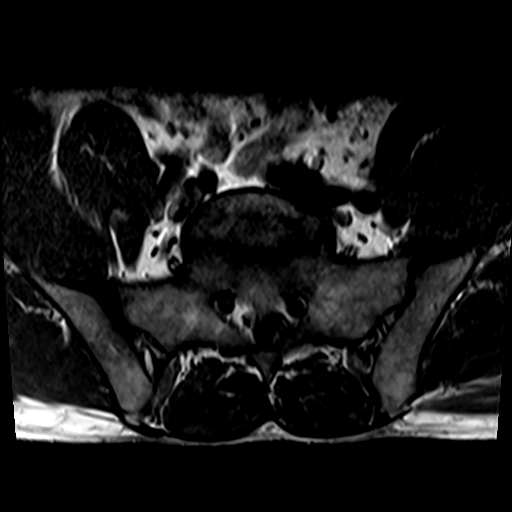
[im 19/38]
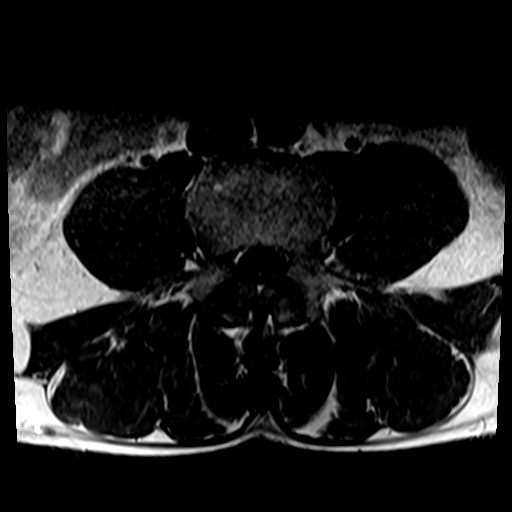
[im 32/38]
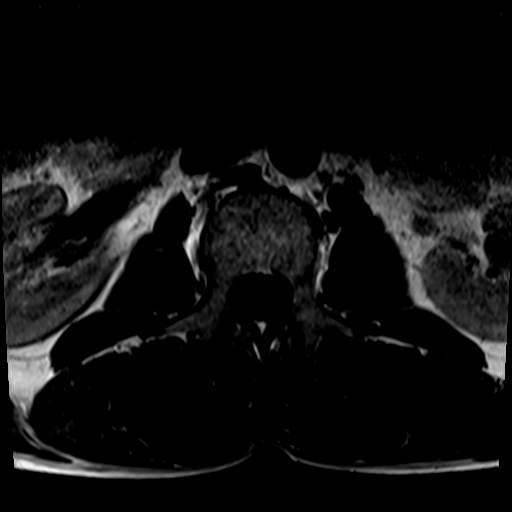

[25 of 48 positions shown; findings below may reference images not displayed]

FINDINGS: Segmentation: There are five lumbar type vertebral bodies. The last
full intervertebral disc space is labeled L5-S1.

Alignment:  Normal

Vertebrae:  Normal marrow signal.  No bone lesion or fracture.

Conus medullaris and cauda equina: Conus extends to the L1 level.
Conus and cauda equina appear normal.

Paraspinal and other soft tissues: No significant findings. Probable
left-sided pararenal cysts.

Disc levels:

L1-2: Minimal disc bulge with minimal lateral recess encroachment.
No spinal or foraminal stenosis.

L2-3: Slight bulging annulus with mild bilateral lateral recess
encroachment but no significant spinal or foraminal stenosis.
Shallow right foraminal disc protrusion without direct neural
compression.

L3-4: Bulging annulus with flattening of the ventral thecal sac.
There is also mild facet disease and the combination creates early
spinal and lateral recess stenosis. Mild foraminal encroachment
bilaterally.

L4-5: Bulging degenerated annulus, shallow central disc protrusion,
short pedicles, moderate facet disease and ligamentum flavum
thickening all contributing to moderate to moderately severe spinal
and bilateral lateral recess stenosis. No significant foraminal
stenosis.

L5-S1: Diffuse bulging annulus and moderate to advanced facet
disease, right greater than left. Mild spinal and bilateral lateral
recess stenosis. No significant foraminal stenosis.

Incidental Tarlov cysts are noted behind S2.
IMPRESSION: 1. Multifactorial moderate to moderately severe spinal and bilateral
lateral recess stenosis at L4-5 likely responsible for the patient's
symptoms.
2. Bulging disc and mild facet disease with early spinal and lateral
recess stenosis at L3-4. There is also mild foraminal encroachment
bilaterally.
3. Shallow right foraminal disc protrusion at L2-3 but no direct
neural compression.
4. Multifactorial mild spinal and bilateral lateral recess stenosis
at L5-S1.

## 2020-07-13 ENCOUNTER — Ambulatory Visit (INDEPENDENT_AMBULATORY_CARE_PROVIDER_SITE_OTHER): Payer: Self-pay | Admitting: Family Medicine

## 2020-07-13 ENCOUNTER — Encounter: Payer: Self-pay | Admitting: Family Medicine

## 2020-07-13 DIAGNOSIS — R509 Fever, unspecified: Secondary | ICD-10-CM

## 2020-07-13 DIAGNOSIS — J069 Acute upper respiratory infection, unspecified: Secondary | ICD-10-CM

## 2020-07-13 LAB — VERITOR FLU A/B WAIVED
Influenza A: NEGATIVE
Influenza B: NEGATIVE

## 2020-07-13 MED ORDER — AMOXICILLIN 875 MG PO TABS: 875.0000 mg | ORAL_TABLET | Freq: Two times a day (BID) | ORAL | 0 refills | Status: DC

## 2020-07-13 NOTE — Progress Notes (Addendum)
   Virtual Visit via telephone Note Due to COVID-19 pandemic this visit was conducted via telephone. This visit type was conducted due to national recommendations for restrictions regarding the COVID-19 Pandemic (e.g. social distancing, sheltering in place) in an effort to limit this patient's exposure and mitigate transmission in our community. All issues noted in this document were discussed and addressed.  A physical exam was not performed with this format.  I connected with Spencer Stewart on 07/13/20 at 0959 by telephone and verified that I am speaking with the correct person using two identifiers. Spencer Stewart is currently located at home and no one is currently with him during the visit. The provider, Gabriel Earing, FNP is located in their office at time of visit.  I discussed the limitations, risks, security and privacy concerns of performing an evaluation and management service by telephone and the availability of in person appointments. I also discussed with the patient that there may be a patient responsible charge related to this service. The patient expressed understanding and agreed to proceed.  CC: fever  History and Present Illness:  HPI  Spencer Stewart reports a cough and chest congestion that started 10 days ago. He also has had a headache for 2-3 days. He developed a fever with chills and body aches 2-3 days ago as well, with max temp of 101. He has tried mucinex for his congestion with little improvement. He denies chest pain or shortness of breath. His cough is worse at night. He denies known exposure to Covid. He had Covid 1 year ago. He has not been vaccinated again Covid or flu.    ROS As per HPI.   Observations/Objective: Alert and oriented x 3. Able to speak in full sentences without difficulty.   Assessment and Plan: Spencer Stewart was seen today for fever.  Diagnoses and all orders for this visit:  Upper respiratory tract infection, unspecified type/Fever, unspecified fever  cause Labs pending as below. Quarantine. OTC and supportive care. Antibiotic prescribed since symptoms have been present for 10 days with recent worsening. He prefers not to start antibiotic just yet, discussed that this is Spencer and he can pick up antibiotic if symptoms do not improve in a few days. Return to office for new or worsening symptoms, or if symptoms persist.  -     amoxicillin (AMOXIL) 875 MG tablet; Take 1 tablet (875 mg total) by mouth 2 (two) times daily. -     Novel Coronavirus, NAA ([facility]) -     Veritor Flu A/B Waived   Follow Up Instructions: Return to office for new or worsening symptoms, or if symptoms persist.    I discussed the assessment and treatment plan with the patient. The patient was provided an opportunity to ask questions and all were answered. The patient agreed with the plan and demonstrated an understanding of the instructions.   The patient was advised to call back or seek an in-person evaluation if the symptoms worsen or if the condition fails to improve as anticipated.  The above assessment and management plan was discussed with the patient. The patient verbalized understanding of and has agreed to the management plan. Patient is aware to call the clinic if symptoms persist or worsen. Patient is aware when to return to the clinic for a follow-up visit. Patient educated on when it is appropriate to go to the emergency department.   Time call ended: 60   Spencer Stewart Better, FNP

## 2020-07-14 LAB — SARS-COV-2, NAA 2 DAY TAT

## 2020-07-14 LAB — NOVEL CORONAVIRUS, NAA: SARS-CoV-2, NAA: DETECTED — AB

## 2020-07-15 ENCOUNTER — Telehealth: Payer: Self-pay | Admitting: *Deleted

## 2020-07-15 ENCOUNTER — Other Ambulatory Visit: Payer: Self-pay | Admitting: Family Medicine

## 2020-07-15 ENCOUNTER — Encounter: Payer: Self-pay | Admitting: Family

## 2020-07-15 ENCOUNTER — Other Ambulatory Visit: Payer: Self-pay | Admitting: Family

## 2020-07-15 DIAGNOSIS — U071 COVID-19: Secondary | ICD-10-CM

## 2020-07-15 DIAGNOSIS — R059 Cough, unspecified: Secondary | ICD-10-CM

## 2020-07-15 MED ORDER — BENZONATATE 100 MG PO CAPS: 100.0000 mg | ORAL_CAPSULE | Freq: Three times a day (TID) | ORAL | 0 refills | Status: DC | PRN

## 2020-07-15 NOTE — Telephone Encounter (Signed)
I connected by phone with Spencer Stewart on 07/15/2020 at 5:31 PM to discuss the potential use of a new treatment for mild to moderate COVID-19 viral infection in non-hospitalized patients.  This patient is a 65 y.o. male that meets the FDA criteria for Emergency Use Authorization of COVID monoclonal antibody sotrovimab.  Has a (+) direct SARS-CoV-2 viral test result  Has mild or moderate COVID-19   Is NOT hospitalized due to COVID-19  Is within 10 days of symptom onset  Has at least one of the high risk factor(s) for progression to severe COVID-19 and/or hospitalization as defined in EUA.  Specific high risk criteria : BMI > 25, Cardiovascular disease or hypertension and Chronic Lung Disease (COPD and aortic atherosclerosis by CXR)  He had symptoms of fatigue, fever starting 07/11/20. He prior to that had mild nasal drip but tells me there was a marked change in his symptoms. Appropriate to utilize 07/11/20 as symptom onset date.  I have spoken and communicated the following to the patient or parent/caregiver regarding COVID monoclonal antibody treatment:  1. FDA has authorized the emergency use for the treatment of mild to moderate COVID-19 in adults and pediatric patients with positive results of direct SARS-CoV-2 viral testing who are 54 years of age and older weighing at least 40 kg, and who are at high risk for progressing to severe COVID-19 and/or hospitalization.  2. The significant known and potential risks and benefits of COVID monoclonal antibody, and the extent to which such potential risks and benefits are unknown.  3. Information on available alternative treatments and the risks and benefits of those alternatives, including clinical trials.  4. Patients treated with COVID monoclonal antibody should continue to self-isolate and use infection control measures (e.g., wear mask, isolate, social distance, avoid sharing personal items, clean and disinfect "high touch" surfaces, and  frequent handwashing) according to CDC guidelines.   5. The patient or parent/caregiver has the option to accept or refuse COVID monoclonal antibody treatment.  After reviewing this information with the patient, the patient has agreed to receive one of the available covid 19 monoclonal antibodies and will be provided an appropriate fact sheet prior to infusion. Alver Sorrow, NP 07/15/2020 5:31 PM

## 2020-07-15 NOTE — Telephone Encounter (Signed)
Called to discuss with patient about COVID-19 symptoms and the use of one of the available treatments for those with mild to moderate Covid symptoms and at a high risk of hospitalization.  Pt appears to qualify for outpatient treatment due to co-morbid conditions and/or a member of an at-risk group in accordance with the FDA Emergency Use Authorization.   Cough, fatigue, SOB, congestion, low grade fever  Symptom onset: 07/11/20 Vaccinated: no Booster?  Immunocompromised? no Qualifiers: BMI>25. BMI=27.2 per chart 06/29/20   Spencer Stewart

## 2020-07-16 ENCOUNTER — Ambulatory Visit (HOSPITAL_COMMUNITY)
Admission: RE | Admit: 2020-07-16 | Discharge: 2020-07-16 | Disposition: A | Discharge status: Home / Self Care | Payer: Self-pay | Source: Ambulatory Visit | Attending: Pulmonary Disease | Admitting: Pulmonary Disease

## 2020-07-16 DIAGNOSIS — Z6825 Body mass index (BMI) 25.0-25.9, adult: Secondary | ICD-10-CM | POA: Insufficient documentation

## 2020-07-16 DIAGNOSIS — U071 COVID-19: Secondary | ICD-10-CM | POA: Insufficient documentation

## 2020-07-16 DIAGNOSIS — I10 Essential (primary) hypertension: Secondary | ICD-10-CM | POA: Insufficient documentation

## 2020-07-16 DIAGNOSIS — J989 Respiratory disorder, unspecified: Secondary | ICD-10-CM | POA: Insufficient documentation

## 2020-07-16 MED ORDER — SODIUM CHLORIDE 0.9 % IV SOLN
500.0000 mg | Freq: Once | INTRAVENOUS | Status: AC
  Administered 2020-07-16: 500 mg via INTRAVENOUS

## 2020-07-16 MED ORDER — DIPHENHYDRAMINE HCL 50 MG/ML IJ SOLN: 50.0000 mg | Freq: Once | INTRAMUSCULAR | Status: DC | PRN

## 2020-07-16 MED ORDER — ALBUTEROL SULFATE HFA 108 (90 BASE) MCG/ACT IN AERS: 2.0000 | INHALATION_SPRAY | Freq: Once | RESPIRATORY_TRACT | Status: DC | PRN

## 2020-07-16 MED ORDER — SODIUM CHLORIDE 0.9 % IV SOLN: INTRAVENOUS | Status: DC | PRN

## 2020-07-16 MED ORDER — EPINEPHRINE 0.3 MG/0.3ML IJ SOAJ: 0.3000 mg | Freq: Once | INTRAMUSCULAR | Status: DC | PRN

## 2020-07-16 MED ORDER — METHYLPREDNISOLONE SODIUM SUCC 125 MG IJ SOLR
125.0000 mg | Freq: Once | INTRAMUSCULAR | Status: DC | PRN
Start: 2020-07-16 — End: 2020-07-17

## 2020-07-16 MED ORDER — FAMOTIDINE IN NACL 20-0.9 MG/50ML-% IV SOLN: 20.0000 mg | Freq: Once | INTRAVENOUS | Status: DC | PRN

## 2020-07-16 NOTE — Progress Notes (Signed)
Diagnosis: COVID-19  Physician: Dr. Patrick Wright  Procedure: [facility] Infusion [facility] Med: Sotrovimab infusion - Provided patient with sotrovimab fact sheet for patients, parents, and caregivers prior to infusion.   Complications: No immediate complications noted  Discharge: Discharged home    

## 2020-07-16 NOTE — Discharge Instructions (Signed)

## 2020-07-16 NOTE — Progress Notes (Signed)
Patient reviewed Fact Sheet for Patients, Parents, and Caregivers for Emergency Use Authorization (EUA) of sotrovimab for the Treatment of Coronavirus. Patient also reviewed and is agreeable to the estimated cost of treatment. Patient is agreeable to proceed.   

## 2020-08-11 ENCOUNTER — Other Ambulatory Visit: Payer: Self-pay | Admitting: *Deleted

## 2020-08-11 DIAGNOSIS — E349 Endocrine disorder, unspecified: Secondary | ICD-10-CM

## 2020-08-12 MED ORDER — GENOTROPIN MINIQUICK 0.2 MG ~~LOC~~ SOLR: SUBCUTANEOUS | 0 refills | Status: DC

## 2020-08-12 NOTE — Telephone Encounter (Signed)
Patient needs to be seen before next refill.

## 2020-08-13 IMAGING — DX DG HIP (WITH OR WITHOUT PELVIS) 2-3V RIGHT
3 series · 3 of 3 positions shown · non-contrast
Comparison: None.

CLINICAL DATA: Chronic right hip pain

EXAM:
DG HIP (WITH OR WITHOUT PELVIS) 2-3V RIGHT

[pelvis ap]
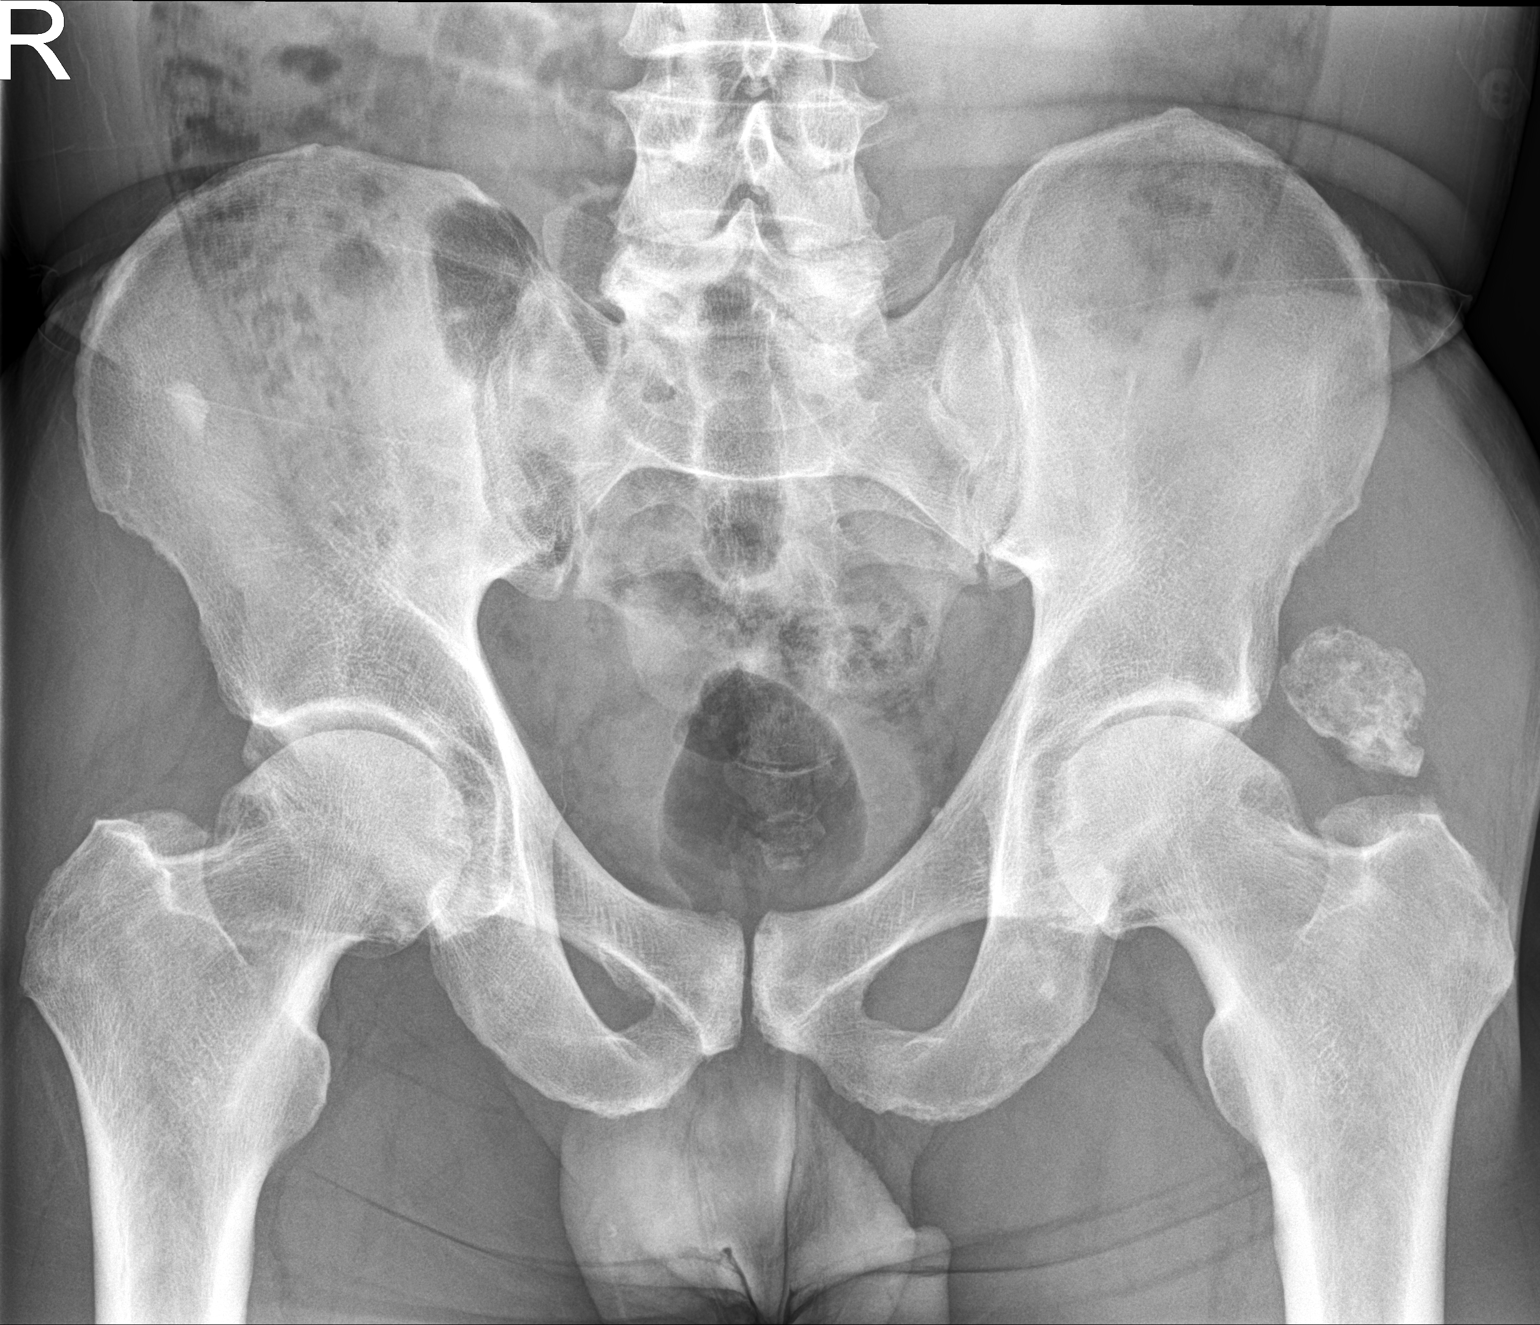

[hip ap]
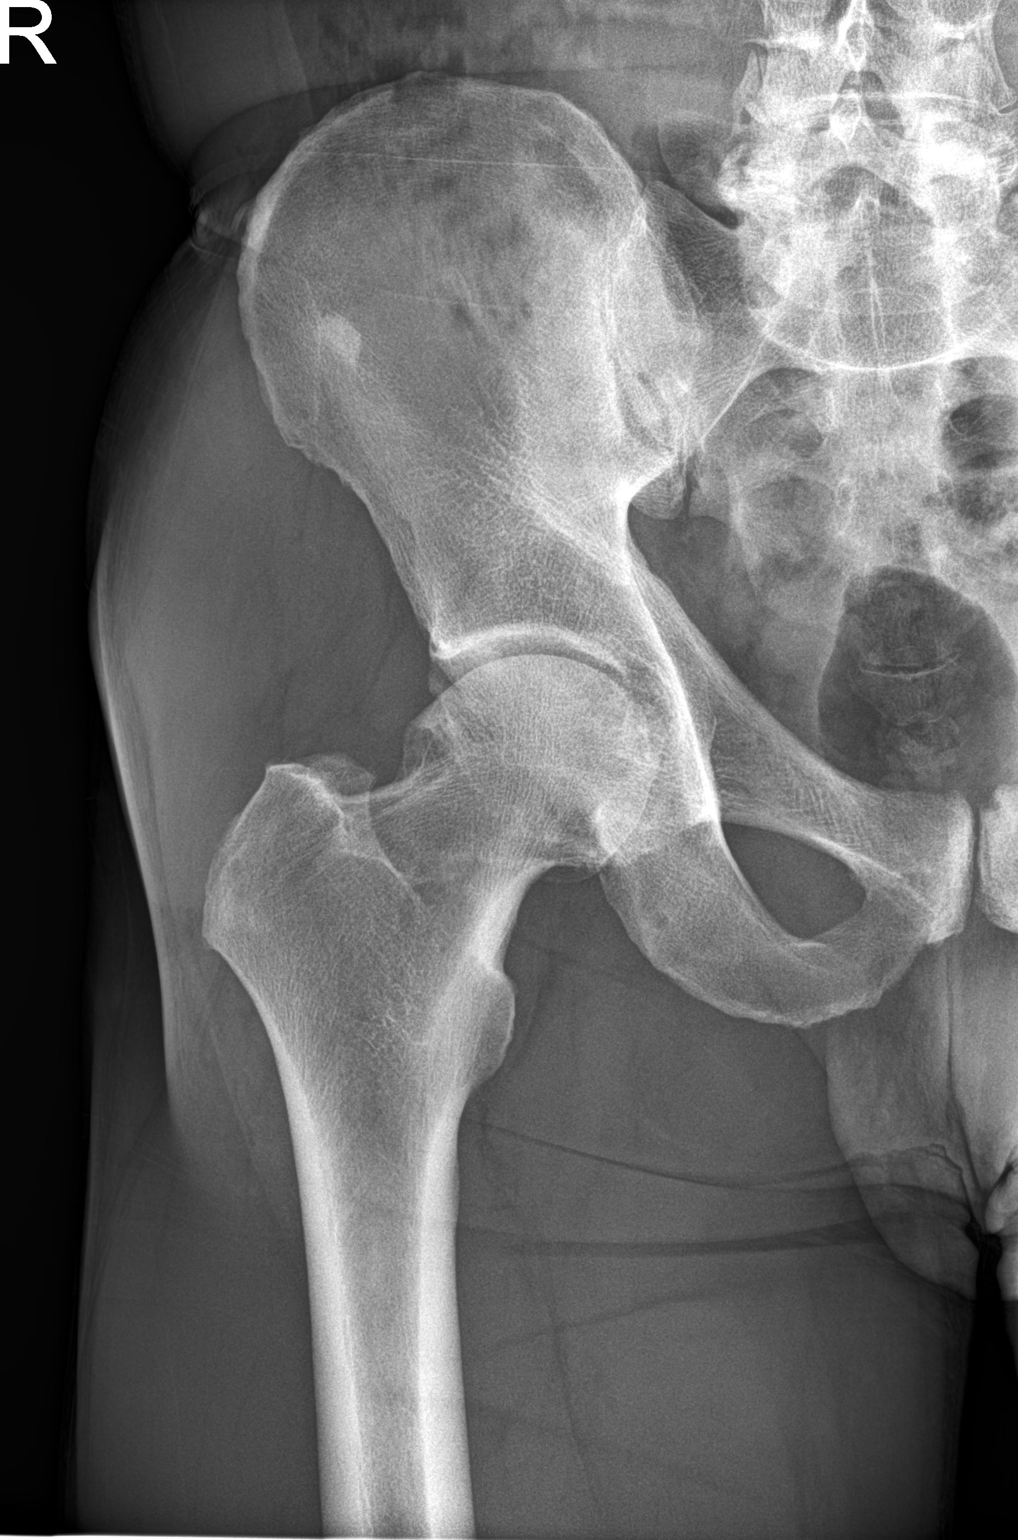

[hip lat]
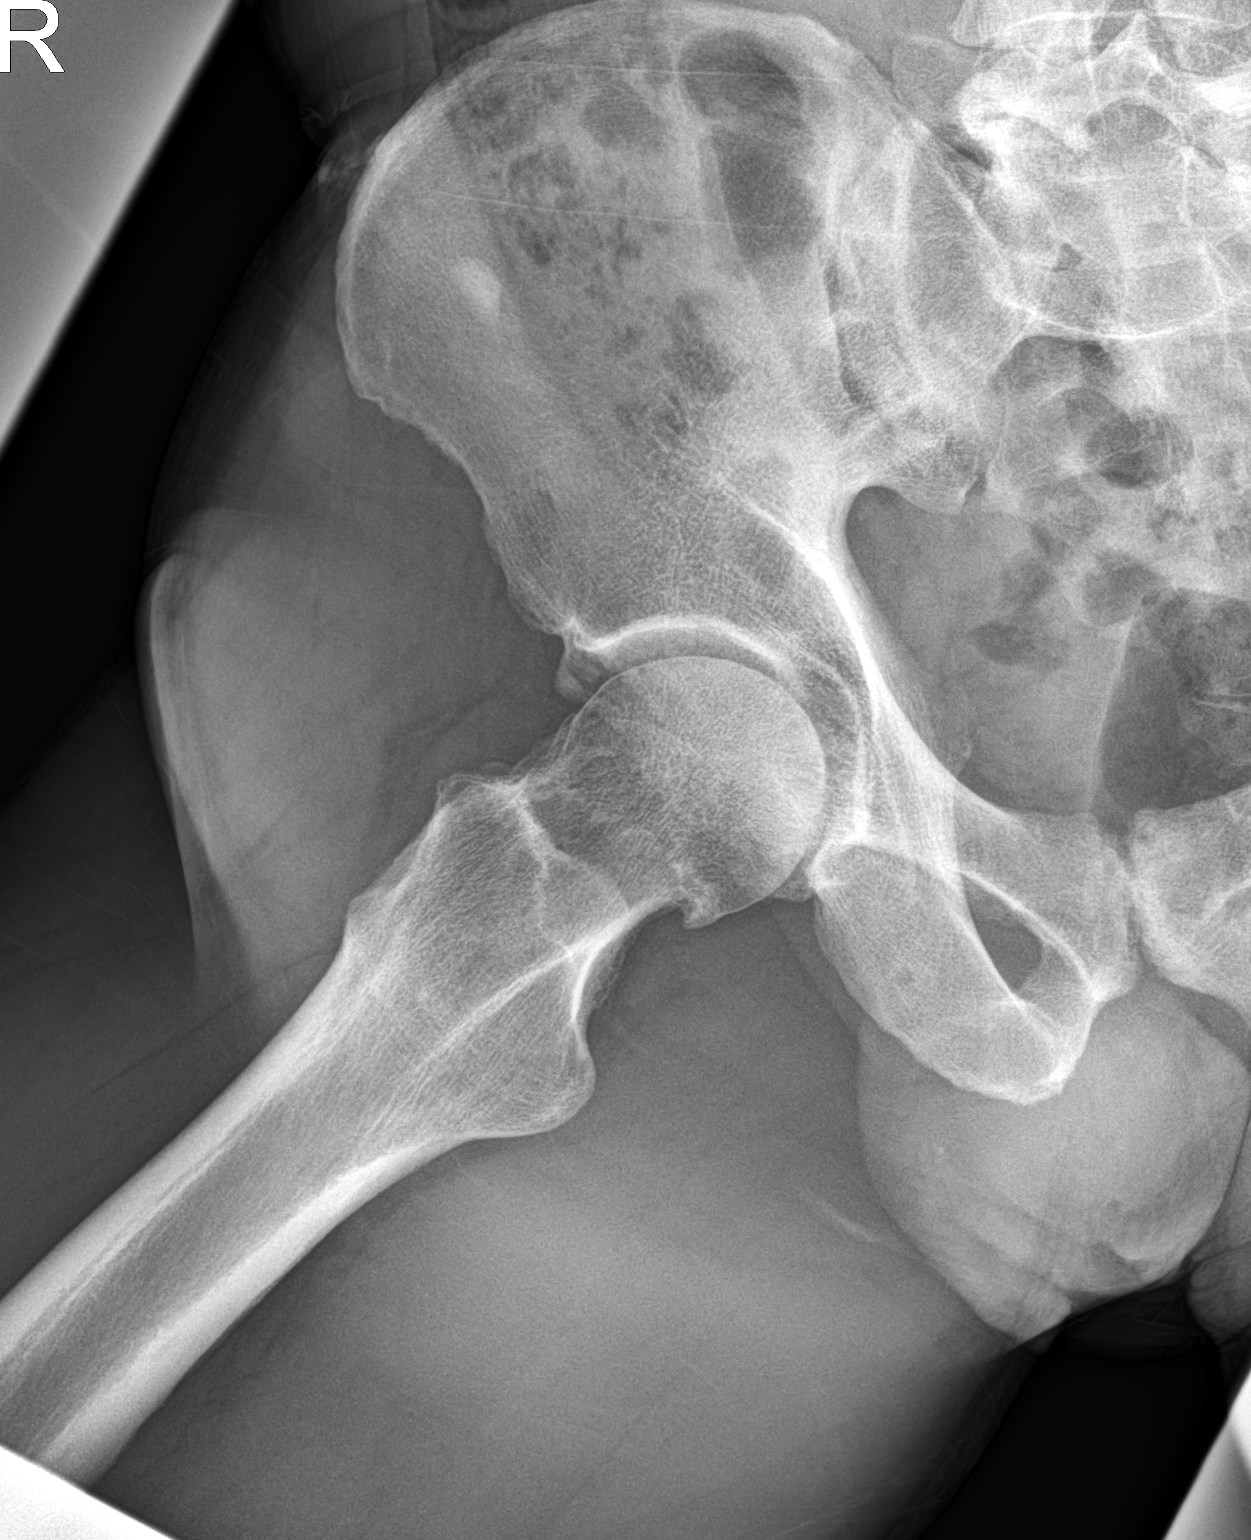

[3 of 3 positions shown; findings below may reference images not displayed]

FINDINGS: No acute fracture or malalignment. There are mild degenerative
changes of the right hip manifested by joint space narrowing and
marginal osteophyte formation. Mild-to-moderate degenerative changes
are noted to the contralateral left hip. There is a prominent focus
of mineralization superior to the left greater trochanter measuring
approximately 4.5 x 2.9 cm, likely reflecting a focus of heterotopic
ossification or myositis ossificans. Small sclerotic focus in the
right iliac wing, likely enostosis.
IMPRESSION: 1. No acute osseous abnormality.
2. Mild osteoarthritis of the right hip.
3. Mild-to-moderate osteoarthritis of the left hip with an
associated 4.5 cm mineralized density, likely representing a focus
of heterotopic ossification or myositis ossificans.

## 2020-09-18 ENCOUNTER — Ambulatory Visit (INDEPENDENT_AMBULATORY_CARE_PROVIDER_SITE_OTHER): Payer: Self-pay | Admitting: Family Medicine

## 2020-09-18 ENCOUNTER — Ambulatory Visit: Payer: Self-pay | Admitting: Family Medicine

## 2020-09-18 ENCOUNTER — Other Ambulatory Visit: Payer: Self-pay

## 2020-09-18 ENCOUNTER — Encounter: Payer: Self-pay | Admitting: Family Medicine

## 2020-09-18 VITALS — BP 137/80 | HR 70 | Temp 97.8°F | Ht 71.0 in | Wt 198.5 lb

## 2020-09-18 DIAGNOSIS — E7841 Elevated Lipoprotein(a): Secondary | ICD-10-CM

## 2020-09-18 DIAGNOSIS — I700 Atherosclerosis of aorta: Secondary | ICD-10-CM | POA: Insufficient documentation

## 2020-09-18 DIAGNOSIS — E349 Endocrine disorder, unspecified: Secondary | ICD-10-CM

## 2020-09-18 MED ORDER — GENOTROPIN MINIQUICK 0.2 MG ~~LOC~~ SOLR: SUBCUTANEOUS | 0 refills | Status: DC

## 2020-09-18 MED ORDER — TESTOSTERONE CYPIONATE 200 MG/ML IM SOLN: INTRAMUSCULAR | 5 refills | Status: DC

## 2020-09-18 NOTE — Patient Instructions (Signed)
[facility] (11th ed., pp. 538-555). [place], [place]: Elsevier.">  Testosterone Replacement Therapy Testosterone replacement therapy (TRT) treats men who have a low testosterone level. Testosterone is a male hormone that is produced in the testicles. It is responsible for typical male characteristics and for maintaining a man's sex drive and ability to get an erection. Testosterone also supports bone and muscle health. Low testosterone may not need to be treated. Your health care provider may recommend TRT if you have symptoms such as a low sex drive or erection problems, weak muscles or bones, or low energy. Types of TRT You and your health care provider will decide which form is best for you. TRT is available in the following forms:  Topical gels, creams, lotions, or sprays. Do not let other people, especially women or children, come in contact with the skin where the testosterone was applied.  Nasal gels.  Patches.  Pills.  Injections into the muscle or under the skin.  Long-acting pellets inserted under the skin. The amount of TRT you take and how long you take it is based on your condition. It is important to:  Begin TRT with the lowest possible dosage.  Stop TRT if your health care provider tells you to stop.  Work with your health care provider so that you feel informed and comfortable with your decision.   Tell a health care provider about:  Any allergies you have.  Any personal or family history of blood clots, breast cancer, or prostate cancer.  If you have sleep apnea or have been told that you snore.  All medicines you are taking, including vitamins, herbs, eye drops, creams, and over-the-counter medicines.  Any surgeries you have had.  Any other medical conditions you have.  If you wish to have biological children someday. What are the benefits? Benefits of TRT vary but may include improved sexual function, muscle mass or strength, mood, or quality of  life. What are the risks? Risks of TRT vary depending on your individual and medical history. Side effects can be related to the type of TRT you choose. If you choose products that are used on the skin, you may have skin irritation. If you get injections, you may have redness and swelling at the injection site. Side effects that can happen with any form of TRT include:  Lower sperm count.  Acne.  Swelling of your legs or feet, or tenderness in the chest or breast area.  Sleep disturbances and mood swings.  Enlarged prostate.  Increase in your red blood count. It is unclear if testosterone increases the risk of some serious conditions. Talk with your health care provider about your risk for these conditions, including:  Blood clots.  Heart disease, such as stroke or heart attack.  Prostate cancer. Risks of TRT may increase if you:  Have had or are at risk for prostate cancer or breast cancer.  Have had a previous stroke or heart attack.  Have a high number of red blood cells.  Have treated or untreated sleep apnea.  Have a large prostate. The long-term safety of TRT is not known. Follow these instructions at home:  Take over-the-counter and prescription medicines only as told by your health care provider.  Lose weight if you are overweight. Ask your health care provider to help you start a healthy diet and exercise program to reach and maintain a healthy weight.  Work with your health care provider to manage other medical conditions that may lower your testosterone. These include   obesity, high blood pressure, high cholesterol, diabetes, liver disease, kidney disease, and sleep apnea.  Do not use any testosterone replacement therapies that are not prescribed by your health care provider.  Keep all follow-up visits. This is important. Where to find more information  [employer]: www.urologyhealth.org  Endocrine Society: www.hormone.org Contact a  health care provider if:  You have side effects from your TRT.  You have pain or swelling in your legs.  You have problems urinating.  You have lumps or changes in your breasts or armpits. Get help right away if:  You have shortness of breath.  You have chest pain.  You have slurred speech.  You have weakness or numbness in any part of your arms or legs. These symptoms may represent a serious problem that is an emergency. Do not wait to see if the symptoms will go away. Get medical help right away. Call your local emergency services (911 in the [place] Do not drive yourself to the hospital. Summary  Testosterone replacement therapy (TRT) is used to treat men who have a low testosterone level.  TRT should only be prescribed by and under the supervision of a health care provider.  Tell a health care provider about any medical conditions you have.  TRT may have side effects.  Talk with your health care provider about all of the risks and benefits before you start therapy. This information is not intended to replace advice given to you by your health care provider. Make sure you discuss any questions you have with your health care provider. Document Revised: 04/14/2020 Document Reviewed: 04/14/2020 Elsevier Patient Education  2021 [employer]vier Inc.  

## 2020-09-18 NOTE — Progress Notes (Signed)
Established Patient Office Visit  Subjective:  Patient ID: Spencer Stewart, male    DOB: 1956-02-19  Age: 65 y.o. MRN: [id number]  CC:  Chief Complaint  Patient presents with  . Medical Management of Chronic Issues    HPI Spencer Stewart presents for chronic follow up. He does not have insurance at this time but will have insurance in about 2 months when he turns 49. He reports that he has been doing well. He has been out of his testosterone replacement for the last 3 weeks. He reports feeling sluggish the few days before his injection is due. He does not wish to have his cholesterol checked today.   Past Medical History:  Diagnosis Date  . Hyperlipidemia     Past Surgical History:  Procedure Laterality Date  . KNEE ARTHROSCOPY Left 2000  . TONSILLECTOMY AND ADENOIDECTOMY      Family History  Problem Relation Age of Onset  . Stroke Father   . Colon cancer Cousin     Social History   Socioeconomic History  . Marital status: Married    Spouse name: Not on file  . Number of children: Not on file  . Years of education: Not on file  . Highest education level: Not on file  Occupational History  . Not on file  Tobacco Use  . Smoking status: Former Research scientist (life sciences)  . Smokeless tobacco: Former Network engineer  . Vaping Use: Never used  Substance and Sexual Activity  . Alcohol use: Yes    Comment: twice weekly  . Drug use: No  . Sexual activity: Not on file  Other Topics Concern  . Not on file  Social History Narrative  . Not on file   Social Determinants of Health   Financial Resource Strain: Not on file  Food Insecurity: Not on file  Transportation Needs: Not on file  Physical Activity: Not on file  Stress: Not on file  Social Connections: Not on file  Intimate Partner Violence: Not on file    Outpatient Medications Prior to Visit  Medication Sig Dispense Refill  . aspirin 81 MG chewable tablet Chew by mouth daily.    . cetirizine (ZYRTEC) 10 MG tablet Take 1  tablet (10 mg total) by mouth daily. 30 tablet 11  . fluticasone (FLONASE) 50 MCG/ACT nasal spray Place 2 sprays into both nostrils daily. 16 g 6  . Magnesium 400 MG TABS Take by mouth.    . Somatropin (GENOTROPIN) 0.2 MG SOLR Take as directed 56 each 0  . testosterone cypionate (DEPOTESTOSTERONE CYPIONATE) 200 MG/ML injection INJECT 1ML IM EVERY 14 DAYS. Needs annual check and labs 10 mL 5  . UNABLE TO FIND Med Name: flax seed , vit d, zinc and magnesium and BCAA branch-chain amino acids    . amoxicillin (AMOXIL) 875 MG tablet Take 1 tablet (875 mg total) by mouth 2 (two) times daily. 20 tablet 0  . benzonatate (TESSALON PERLES) 100 MG capsule Take 1 capsule (100 mg total) by mouth 3 (three) times daily as needed for cough. 20 capsule 0   No facility-administered medications prior to visit.    Allergies  Allergen Reactions  . Codeine   . Crestor [Rosuvastatin] Other (See Comments)    Causes myalgia    ROS Review of Systems Negative unless specially indicated above in HPI.   Objective:    Physical Exam Vitals and nursing note reviewed.  Constitutional:      Appearance: Normal appearance.  Cardiovascular:  Rate and Rhythm: Normal rate and regular rhythm.     Heart sounds: Normal heart sounds. No murmur heard.   Pulmonary:     Effort: Pulmonary effort is normal. No respiratory distress.     Breath sounds: Normal breath sounds.  Abdominal:     General: Bowel sounds are normal.     Palpations: Abdomen is soft.  Musculoskeletal:     Right lower leg: No edema.     Left lower leg: No edema.  Skin:    General: Skin is warm and dry.  Neurological:     General: No focal deficit present.     Mental Status: He is alert and oriented to person, place, and time.  Psychiatric:        Mood and Affect: Mood normal.        Behavior: Behavior normal.        Thought Content: Thought content normal.        Judgment: Judgment normal.     BP 137/80   Pulse 70   Temp 97.8 F (36.6  C) (Temporal)   Ht '5\' 11"'  (1.803 m)   Wt 198 lb 8 oz (90 kg)   BMI 27.69 kg/m  Wt Readings from Last 3 Encounters:  09/18/20 198 lb 8 oz (90 kg)  03/04/19 202 lb 6.4 oz (91.8 kg)  02/26/19 199 lb 3.2 oz (90.4 kg)     Health Maintenance Due  Topic Date Due  . Hepatitis C Screening  Never done  . HIV Screening  Never done    There are no preventive care reminders to display for this patient.  No results found for: TSH Lab Results  Component Value Date   WBC 5.9 03/04/2019   HGB 15.9 03/04/2019   HCT 47.2 03/04/2019   MCV 89 03/04/2019   PLT 204 03/04/2019   Lab Results  Component Value Date   NA 141 03/04/2019   K 4.5 03/04/2019   CO2 23 03/04/2019   GLUCOSE 152 (H) 03/04/2019   BUN 20 03/04/2019   CREATININE 1.08 03/04/2019   BILITOT 0.4 03/04/2019   ALKPHOS 101 03/04/2019   AST 45 (H) 03/04/2019   ALT 50 (H) 03/04/2019   PROT 6.3 03/04/2019   ALBUMIN 4.1 03/04/2019   CALCIUM 9.1 03/04/2019   Lab Results  Component Value Date   CHOL 165 03/04/2019   Lab Results  Component Value Date   HDL 36 (L) 03/04/2019   Lab Results  Component Value Date   LDLCALC 114 (H) 03/04/2019   Lab Results  Component Value Date   TRIG 80 03/04/2019   Lab Results  Component Value Date   CHOLHDL 4.6 03/04/2019   Lab Results  Component Value Date   HGBA1C 6.1 (H) 04/29/2016      Assessment & Plan:   [name] was seen today for medical management of chronic issues.  Diagnoses and all orders for this visit:  Hypotestosteronemia Labs pending as below. Refill provided today. Follow up in 3 months.  -     CMP14+EGFR -     CBC with Differential -     Testosterone,Free and Total -     testosterone cypionate (DEPOTESTOSTERONE CYPIONATE) 200 MG/ML injection; INJECT 1ML IM EVERY 14 DAYS. Needs annual check and labs -     Somatropin (GENOTROPIN) 0.2 MG SOLR; Take as directed  Elevated lipoprotein(a) Declined cholesterol panel today. Diet and exercise. Follow up in 3  months- patient will have insurance at that point and agrees to have  labs completed at that time.   Aortic atherosclerosis On aspirin therapy. Declined cholesterol panel today. Follow up in 3 months.    Follow-up: Return in about 3 months (around 12/19/2020) for chronic follow up.    Gwenlyn Perking, FNP

## 2020-09-22 LAB — CBC WITH DIFFERENTIAL/PLATELET
Basophils Absolute: 0 10*3/uL (ref 0.0–0.2)
Basos: 1 %
EOS (ABSOLUTE): 0.1 10*3/uL (ref 0.0–0.4)
Eos: 1 %
Hematocrit: 51.4 % — ABNORMAL HIGH (ref 37.5–51.0)
Hemoglobin: 17.9 g/dL — ABNORMAL HIGH (ref 13.0–17.7)
Immature Grans (Abs): 0 10*3/uL (ref 0.0–0.1)
Immature Granulocytes: 0 %
Lymphocytes Absolute: 1.9 10*3/uL (ref 0.7–3.1)
Lymphs: 31 %
MCH: 30.7 pg (ref 26.6–33.0)
MCHC: 34.8 g/dL (ref 31.5–35.7)
MCV: 88 fL (ref 79–97)
Monocytes Absolute: 0.7 10*3/uL (ref 0.1–0.9)
Monocytes: 12 %
Neutrophils Absolute: 3.3 10*3/uL (ref 1.4–7.0)
Neutrophils: 55 %
Platelets: 224 10*3/uL (ref 150–450)
RBC: 5.84 x10E6/uL — ABNORMAL HIGH (ref 4.14–5.80)
RDW: 11.8 % (ref 11.6–15.4)
WBC: 6 10*3/uL (ref 3.4–10.8)

## 2020-09-22 LAB — CMP14+EGFR
ALT: 45 IU/L — ABNORMAL HIGH (ref 0–44)
AST: 44 IU/L — ABNORMAL HIGH (ref 0–40)
Albumin/Globulin Ratio: 2 (ref 1.2–2.2)
Albumin: 4.6 g/dL (ref 3.8–4.8)
Alkaline Phosphatase: 104 IU/L (ref 44–121)
BUN/Creatinine Ratio: 20 (ref 10–24)
BUN: 22 mg/dL (ref 8–27)
Bilirubin Total: 0.5 mg/dL (ref 0.0–1.2)
CO2: 24 mmol/L (ref 20–29)
Calcium: 9.8 mg/dL (ref 8.6–10.2)
Chloride: 99 mmol/L (ref 96–106)
Creatinine, Ser: 1.12 mg/dL (ref 0.76–1.27)
Globulin, Total: 2.3 g/dL (ref 1.5–4.5)
Glucose: 157 mg/dL — ABNORMAL HIGH (ref 65–99)
Potassium: 4.9 mmol/L (ref 3.5–5.2)
Sodium: 138 mmol/L (ref 134–144)
Total Protein: 6.9 g/dL (ref 6.0–8.5)
eGFR: 73 mL/min/{1.73_m2} (ref >59)

## 2020-09-22 LAB — TESTOSTERONE,FREE AND TOTAL
Testosterone, Free: 4.9 pg/mL — ABNORMAL LOW (ref 6.6–18.1)
Testosterone: 199 ng/dL — ABNORMAL LOW (ref 264–916)

## 2020-09-23 ENCOUNTER — Ambulatory Visit: Payer: Self-pay | Admitting: Family Medicine

## 2020-12-28 ENCOUNTER — Ambulatory Visit: Payer: Self-pay | Admitting: Family Medicine

## 2021-01-10 ENCOUNTER — Other Ambulatory Visit: Payer: Self-pay | Admitting: Family Medicine

## 2021-01-10 DIAGNOSIS — E349 Endocrine disorder, unspecified: Secondary | ICD-10-CM

## 2021-01-25 ENCOUNTER — Ambulatory Visit: Payer: Self-pay | Admitting: Family Medicine

## 2021-02-02 ENCOUNTER — Ambulatory Visit (INDEPENDENT_AMBULATORY_CARE_PROVIDER_SITE_OTHER): Payer: Medicare Other | Admitting: Family Medicine

## 2021-02-02 ENCOUNTER — Encounter: Payer: Self-pay | Admitting: Family Medicine

## 2021-02-02 ENCOUNTER — Other Ambulatory Visit: Payer: Self-pay

## 2021-02-02 VITALS — BP 131/88 | HR 79 | Temp 98.3°F | Resp 20 | Ht 71.0 in | Wt 195.0 lb

## 2021-02-02 DIAGNOSIS — D582 Other hemoglobinopathies: Secondary | ICD-10-CM

## 2021-02-02 DIAGNOSIS — E349 Endocrine disorder, unspecified: Secondary | ICD-10-CM | POA: Diagnosis not present

## 2021-02-02 DIAGNOSIS — E7841 Elevated Lipoprotein(a): Secondary | ICD-10-CM

## 2021-02-02 DIAGNOSIS — I700 Atherosclerosis of aorta: Secondary | ICD-10-CM

## 2021-02-02 DIAGNOSIS — Z1159 Encounter for screening for other viral diseases: Secondary | ICD-10-CM

## 2021-02-02 DIAGNOSIS — R5383 Other fatigue: Secondary | ICD-10-CM

## 2021-02-02 DIAGNOSIS — Z114 Encounter for screening for human immunodeficiency virus [HIV]: Secondary | ICD-10-CM

## 2021-02-02 DIAGNOSIS — Z6827 Body mass index (BMI) 27.0-27.9, adult: Secondary | ICD-10-CM

## 2021-02-02 NOTE — Patient Instructions (Signed)
Testosterone Test Why am I having this test? Testosterone is a hormone made by the adrenal glands in the abdomen in both males and females. In males, it is also made by the testicles. Starting at puberty, testosterone stimulates the development of secondary sex characteristics in males. This includes a deeper voice, muscle and body hairgrowth, and penis enlargement. In females, testosterone is also produced in the ovaries. The male bodyconverts testosterone into estradiol, the main male sex hormone. An abnormal level of testosterone can cause health problems in both males and females. You may have this test if your health care provider suspects that anabnormal testosterone level is causing or contributing to other health problems. In males, an abnormally low testosterone level can cause: Inability to have children. Trouble getting or maintaining an erection. Delayed puberty. In females, an abnormally high testosterone level can cause: Infertility. Polycystic ovary syndrome. Development of masculine features. What is being tested? This test measures the amount of total testosterone in your blood. What kind of sample is taken?  A blood sample is required for this test. It is usually collected by inserting a needle into a blood vessel. The sample is most often collected in the morningbecause that is when testosterone is usually the highest. How do I prepare for this test? Follow instructions from your health care provider about changing or stoppingyour regular medicines. Tell a health care provider about: Any allergies you have. All medicines you are taking, including vitamins, herbs, eye drops, creams, and over-the-counter medicines. Any blood disorders you have. Any surgeries you have had. Any medical conditions you have. Whether you are pregnant or may be pregnant. How are the results reported? Your test results will be reported as a value that indicates how much testosterone is in  your blood. This will be given as nanograms of testosteroneper deciliter of blood (ng/dL). Your health care provider will compare your test results to normal ranges that were established after testing a large group of people (reference ranges). Reference ranges may vary among labs and hospitals. For this test, common reference ranges for total testosterone are: Male: 7 months to 65 years old: less than 30 ng/dL. 45-14 years old: less than 300 ng/dL. 58-25 years old: 170-540 ng/dL. 66-28 years old: 250-910 ng/dL. 20 years and older: 280-1,080 ng/dL. Male: 7 months to 65 years old: less than 30 ng/dL. 18-15 years old: less than 40 ng/dL. 28-58 years old: less than 60 ng/dL. 61-73 years old: less than 70 ng/dL. 20 years and older: less than 70 ng/dL. What do the results mean? A result that is within your reference range means that you have a normalamount of testosterone in your blood. In males: A high testosterone level may mean that you: Have certain types of tumors. Have an overactive thyroid gland (hyperthyroidism). Currently use anabolic steroids or used anabolic steroids in the past. Have an inherited disorder that affects the adrenal glands (congenital adrenal hyperplasia). Are starting puberty early (precocious puberty). A low testosterone level may mean that you: Have certain genetic diseases. Have had certain viral infections, such as mumps. Have a condition that affects the pituitary gland. Have injured your testicles. In females: A high testosterone level may mean that you have: Certain types of tumors, such as ovarian or adrenal gland tumors. An inherited disorder that affects certain cells in the adrenal glands (congenital adrenal hyperplasia). Polycystic ovary syndrome. A low testosterone level usually will not cause health problems. Talk with your health care provider about what your results mean. Questions to  what your results mean. °Questions to ask your health care provider °Ask your health care  provider, or the department that is doing the test: °When will my results be ready? °How will I get my results? °What are my treatment options? °What other tests do I need? °What are my next steps? °Summary °Testosterone is a hormone made by the adrenal glands in the abdomen in both males and females. In males, it is also made by the testicles. Starting at puberty, testosterone stimulates the development of secondary sex characteristics in males. °In females, testosterone is also produced in the ovaries. The male body converts testosterone into estradiol, the main male sex hormone. °An abnormal level of testosterone can cause health issues in both males and females. You may have this test if your health care provider suspects that an abnormal testosterone level is causing or contributing to other health problems. °This information is not intended to replace advice given to you by your health care provider. Make sure you discuss any questions you have with your health care provider. °Document Revised: 03/23/2020 Document Reviewed: 03/23/2020 °Elsevier Patient Education © 2022 Elsevier Inc. ° ° ° °

## 2021-02-02 NOTE — Progress Notes (Signed)
Established Patient Office Visit  Subjective:  Patient ID: Spencer Stewart, male    DOB: [birth year]  Age: 65 y.o. MRN: [id number]  CC:  Chief Complaint  Patient presents with   Medical Management of Chronic Issues    HPI [name] presents for chronic follow up. He recently obtained insurance. He does report some fatigue. He reports that he general has energy for 10 days or so after his testosterone injections, but then will start to feel fatigued for the last few days before his next injection is due.   He is an avid Firefighter. He walks 3x a week and weight lifts 4-5 times a week. He is active throughout the day. Reports a well balanced diet. He is fasting this morning. Has a history of elevated cholesterol and works to control this with his diet and exercise.   Depression screen [facility] 2/9 02/02/2021 09/18/2020 03/04/2019  Decreased Interest 0 0 0  Down, Depressed, Hopeless 0 0 0  PHQ - 2 Score 0 0 0  Altered sleeping 0 - -  Tired, decreased energy 1 - -  Change in appetite 0 - -  Feeling bad or failure about yourself  0 - -  Trouble concentrating 0 - -  Moving slowly or fidgety/restless 0 - -  Suicidal thoughts 0 - -  PHQ-9 Score 1 - -  Difficult doing work/chores Not difficult at all - -    Past Medical History:  Diagnosis Date   Hyperlipidemia     Past Surgical History:  Procedure Laterality Date   KNEE ARTHROSCOPY Left 2000   TONSILLECTOMY AND ADENOIDECTOMY      Family History  Problem Relation Age of Onset   Stroke Father    Colon cancer Cousin     Social History   Socioeconomic History   Marital status: Married    Spouse name: Not on file   Number of children: Not on file   Years of education: Not on file   Highest education level: Not on file  Occupational History   Not on file  Tobacco Use   Smoking status: Former   Smokeless tobacco: Former  Scientific laboratory technician Use: Never used  Substance and Sexual Activity   Alcohol use: Yes    Comment:  twice weekly   Drug use: No   Sexual activity: Not on file  Other Topics Concern   Not on file  Social History Narrative   Not on file   Social Determinants of Health   Financial Resource Strain: Not on file  Food Insecurity: Not on file  Transportation Needs: Not on file  Physical Activity: Not on file  Stress: Not on file  Social Connections: Not on file  Intimate Partner Violence: Not on file    Outpatient Medications Prior to Visit  Medication Sig Dispense Refill   aspirin 81 MG chewable tablet Chew by mouth daily.     GENOTROPIN MINIQUICK 0.2 MG PRSY TAKE AS DIRECTED 56 each 0   Magnesium 400 MG TABS Take by mouth.     testosterone cypionate (DEPOTESTOSTERONE CYPIONATE) 200 MG/ML injection INJECT 1ML IM EVERY 14 DAYS. Needs annual check and labs 10 mL 5   UNABLE TO FIND Med Name: flax seed , vit d, zinc and magnesium and BCAA branch-chain amino acids     cetirizine (ZYRTEC) 10 MG tablet Take 1 tablet (10 mg total) by mouth daily. (Patient not taking: Reported on 02/02/2021) 30 tablet 11   fluticasone (FLONASE)  50 MCG/ACT nasal spray Place 2 sprays into both nostrils daily. (Patient not taking: Reported on 02/02/2021) 16 g 6   No facility-administered medications prior to visit.    Allergies  Allergen Reactions   Codeine    Crestor [Rosuvastatin] Other (See Comments)    Causes myalgia    ROS Review of Systems Negative unless specially indicated above in HPI.    Objective:    Physical Exam Vitals and nursing note reviewed.  Constitutional:      General: He is not in acute distress.    Appearance: Normal appearance. He is not ill-appearing, toxic-appearing or diaphoretic.  HENT:     Head: Normocephalic and atraumatic.  Neck:     Vascular: No carotid bruit.  Cardiovascular:     Rate and Rhythm: Normal rate and regular rhythm.     Heart sounds: Normal heart sounds. No murmur heard. Pulmonary:     Effort: Pulmonary effort is normal. No respiratory distress.      Breath sounds: Normal breath sounds.  Abdominal:     General: Bowel sounds are normal. There is no distension.     Palpations: Abdomen is soft.     Tenderness: There is no abdominal tenderness. There is no guarding or rebound.  Musculoskeletal:     Cervical back: No tenderness.     Right lower leg: No edema.     Left lower leg: No edema.  Skin:    General: Skin is warm and dry.  Neurological:     General: No focal deficit present.     Mental Status: He is alert and oriented to person, place, and time.  Psychiatric:        Mood and Affect: Mood normal.        Behavior: Behavior normal.    BP 131/88   Pulse 79   Temp 98.3 F (36.8 C) (Temporal)   Resp 20   Ht _0  (1.803 m)   Wt 195 lb (88.5 kg)   SpO2 95%   BMI 27.20 kg/m  Wt Readings from Last 3 Encounters:  02/02/21 195 lb (88.5 kg)  09/18/20 198 lb 8 oz (90 kg)  03/04/19 202 lb 6.4 oz (91.8 kg)     Health Maintenance Due  Topic Date Due   COVID-19 Vaccine (1) Never done   HIV Screening  Never done   Hepatitis C Screening  Never done   Zoster Vaccines- Shingrix (1 of 2) Never done   PNA vac Low Risk Adult (1 of 2 - PCV13) Never done   INFLUENZA VACCINE  02/01/2021    There are no preventive care reminders to display for this patient.  No results found for: TSH Lab Results  Component Value Date   WBC 6.0 09/18/2020   HGB 17.9 (H) 09/18/2020   HCT 51.4 (H) 09/18/2020   MCV 88 09/18/2020   PLT 224 09/18/2020   Lab Results  Component Value Date   NA 138 09/18/2020   K 4.9 09/18/2020   CO2 24 09/18/2020   GLUCOSE 157 (H) 09/18/2020   BUN 22 09/18/2020   CREATININE 1.12 09/18/2020   BILITOT 0.5 09/18/2020   ALKPHOS 104 09/18/2020   AST 44 (H) 09/18/2020   ALT 45 (H) 09/18/2020   PROT 6.9 09/18/2020   ALBUMIN 4.6 09/18/2020   CALCIUM 9.8 09/18/2020   EGFR 73 09/18/2020   Lab Results  Component Value Date   CHOL 165 03/04/2019   Lab Results  Component Value Date   HDL 36 (L)  03/04/2019    Lab Results  Component Value Date   LDLCALC 114 (H) 03/04/2019   Lab Results  Component Value Date   TRIG 80 03/04/2019   Lab Results  Component Value Date   CHOLHDL 4.6 03/04/2019   Lab Results  Component Value Date   HGBA1C 6.1 (H) 04/29/2016      Assessment & Plan:   Roylee was seen today for medical management of chronic issues.  Diagnoses and all orders for this visit:  Hypotestosteronemia Labs pending as below. Will send in refills pending results.  -     Testosterone,Free and Total  Elevated hemoglobin (HCC) Labs pending.  -     Anemia Profile B  Elevated lipoprotein(a) Diet and exercise. Labs pending.  -     Lipid panel  Aortic atherosclerosis ([place]) Labs pending. On aspirin therapy.  -     CMP14+EGFR -     Lipid panel -     Anemia Profile B  BMI 27.0-27.9,adult Does not appear overweight. Diet and exercise.  -     CMP14+EGFR -     Lipid panel -     Anemia Profile B  Fatigue, unspecified type Labs pending.  -     TSH  Encounter for screening for HIV -     HIV Antibody (routine testing w rflx)  Need for hepatitis C screening test -     Hepatitis C antibody   Follow-up: Return in about 3 months (around 05/05/2021) for chronic follow up.   The patient indicates understanding of these issues and agrees with the plan.  Gwenlyn Perking, FNP

## 2021-02-03 ENCOUNTER — Other Ambulatory Visit: Payer: Self-pay | Admitting: Family Medicine

## 2021-02-04 LAB — HGB A1C W/O EAG: Hgb A1c MFr Bld: 6 % — ABNORMAL HIGH (ref 4.8–5.6)

## 2021-02-04 LAB — SPECIMEN STATUS REPORT

## 2021-02-05 LAB — CMP14+EGFR
ALT: 58 IU/L — ABNORMAL HIGH (ref 0–44)
AST: 45 IU/L — ABNORMAL HIGH (ref 0–40)
Albumin/Globulin Ratio: 2.2 (ref 1.2–2.2)
Albumin: 4.6 g/dL (ref 3.8–4.8)
Alkaline Phosphatase: 98 IU/L (ref 44–121)
BUN/Creatinine Ratio: 17 (ref 10–24)
BUN: 19 mg/dL (ref 8–27)
Bilirubin Total: 0.5 mg/dL (ref 0.0–1.2)
CO2: 25 mmol/L (ref 20–29)
Calcium: 9.7 mg/dL (ref 8.6–10.2)
Chloride: 98 mmol/L (ref 96–106)
Creatinine, Ser: 1.14 mg/dL (ref 0.76–1.27)
Globulin, Total: 2.1 g/dL (ref 1.5–4.5)
Glucose: 150 mg/dL — ABNORMAL HIGH (ref 65–99)
Potassium: 4.6 mmol/L (ref 3.5–5.2)
Sodium: 140 mmol/L (ref 134–144)
Total Protein: 6.7 g/dL (ref 6.0–8.5)
eGFR: 71 mL/min/{1.73_m2} (ref >59)

## 2021-02-05 LAB — ANEMIA PROFILE B
Basophils Absolute: 0.1 10*3/uL (ref 0.0–0.2)
Basos: 1 %
EOS (ABSOLUTE): 0.1 10*3/uL (ref 0.0–0.4)
Eos: 2 %
Ferritin: 139 ng/mL (ref 30–400)
Folate: 16.2 ng/mL (ref >3.0)
Hematocrit: 48.3 % (ref 37.5–51.0)
Hemoglobin: 16.7 g/dL (ref 13.0–17.7)
Immature Grans (Abs): 0 10*3/uL (ref 0.0–0.1)
Immature Granulocytes: 0 %
Iron Saturation: 25 % (ref 15–55)
Iron: 96 ug/dL (ref 38–169)
Lymphocytes Absolute: 1.6 10*3/uL (ref 0.7–3.1)
Lymphs: 29 %
MCH: 30.5 pg (ref 26.6–33.0)
MCHC: 34.6 g/dL (ref 31.5–35.7)
MCV: 88 fL (ref 79–97)
Monocytes Absolute: 0.6 10*3/uL (ref 0.1–0.9)
Monocytes: 11 %
Neutrophils Absolute: 3.1 10*3/uL (ref 1.4–7.0)
Neutrophils: 57 %
Platelets: 198 10*3/uL (ref 150–450)
RBC: 5.48 x10E6/uL (ref 4.14–5.80)
RDW: 12.7 % (ref 11.6–15.4)
Retic Ct Pct: 1.2 % (ref 0.6–2.6)
Total Iron Binding Capacity: 381 ug/dL (ref 250–450)
UIBC: 285 ug/dL (ref 111–343)
Vitamin B-12: 496 pg/mL (ref 232–1245)
WBC: 5.5 10*3/uL (ref 3.4–10.8)

## 2021-02-05 LAB — LIPID PANEL
Chol/HDL Ratio: 4.2 ratio (ref 0.0–5.0)
Cholesterol, Total: 169 mg/dL (ref 100–199)
HDL: 40 mg/dL (ref >39)
LDL Chol Calc (NIH): 116 mg/dL — ABNORMAL HIGH (ref 0–99)
Triglycerides: 70 mg/dL (ref 0–149)
VLDL Cholesterol Cal: 13 mg/dL (ref 5–40)

## 2021-02-05 LAB — HEPATITIS C ANTIBODY: Hep C Virus Ab: <0.1 s/co ratio (ref 0.0–0.9)

## 2021-02-05 LAB — TSH: TSH: 2 u[IU]/mL (ref 0.450–4.500)

## 2021-02-05 LAB — HIV ANTIBODY (ROUTINE TESTING W REFLEX): HIV Screen 4th Generation wRfx: NONREACTIVE

## 2021-02-05 LAB — TESTOSTERONE,FREE AND TOTAL
Testosterone, Free: 22.2 pg/mL — ABNORMAL HIGH (ref 6.6–18.1)
Testosterone: 741 ng/dL (ref 264–916)

## 2021-02-08 ENCOUNTER — Other Ambulatory Visit: Payer: Self-pay | Admitting: Family Medicine

## 2021-02-08 DIAGNOSIS — E349 Endocrine disorder, unspecified: Secondary | ICD-10-CM

## 2021-02-08 MED ORDER — TESTOSTERONE CYPIONATE 200 MG/ML IM SOLN: INTRAMUSCULAR | 5 refills | Status: DC

## 2021-04-08 ENCOUNTER — Other Ambulatory Visit: Payer: Self-pay | Admitting: Family Medicine

## 2021-04-08 DIAGNOSIS — E349 Endocrine disorder, unspecified: Secondary | ICD-10-CM

## 2021-05-05 ENCOUNTER — Ambulatory Visit: Payer: Medicare Other | Admitting: Family Medicine

## 2021-05-12 ENCOUNTER — Other Ambulatory Visit: Payer: Self-pay

## 2021-05-12 ENCOUNTER — Ambulatory Visit (INDEPENDENT_AMBULATORY_CARE_PROVIDER_SITE_OTHER): Payer: Medicare Other | Admitting: Family Medicine

## 2021-05-12 ENCOUNTER — Encounter: Payer: Self-pay | Admitting: Family Medicine

## 2021-05-12 VITALS — BP 118/72 | HR 71 | Temp 98.1°F | Ht 71.0 in | Wt 199.2 lb

## 2021-05-12 DIAGNOSIS — R7303 Prediabetes: Secondary | ICD-10-CM | POA: Diagnosis not present

## 2021-05-12 DIAGNOSIS — E349 Endocrine disorder, unspecified: Secondary | ICD-10-CM | POA: Diagnosis not present

## 2021-05-12 LAB — CMP14+EGFR
ALT: 48 IU/L — ABNORMAL HIGH (ref 0–44)
AST: 34 IU/L (ref 0–40)
Albumin/Globulin Ratio: 1.7 (ref 1.2–2.2)
Albumin: 4.5 g/dL (ref 3.8–4.8)
Alkaline Phosphatase: 109 IU/L (ref 44–121)
BUN/Creatinine Ratio: 16 (ref 10–24)
BUN: 18 mg/dL (ref 8–27)
Bilirubin Total: 0.5 mg/dL (ref 0.0–1.2)
CO2: 28 mmol/L (ref 20–29)
Calcium: 9.4 mg/dL (ref 8.6–10.2)
Chloride: 98 mmol/L (ref 96–106)
Creatinine, Ser: 1.15 mg/dL (ref 0.76–1.27)
Globulin, Total: 2.7 g/dL (ref 1.5–4.5)
Glucose: 210 mg/dL — ABNORMAL HIGH (ref 70–99)
Potassium: 5.4 mmol/L — ABNORMAL HIGH (ref 3.5–5.2)
Sodium: 137 mmol/L (ref 134–144)
Total Protein: 7.2 g/dL (ref 6.0–8.5)
eGFR: 71 mL/min/{1.73_m2} (ref >59)

## 2021-05-12 LAB — CBC WITH DIFFERENTIAL/PLATELET
Basophils Absolute: 0.1 10*3/uL (ref 0.0–0.2)
Basos: 1 %
EOS (ABSOLUTE): 0.1 10*3/uL (ref 0.0–0.4)
Eos: 1 %
Hematocrit: 53.5 % — ABNORMAL HIGH (ref 37.5–51.0)
Hemoglobin: 17.8 g/dL — ABNORMAL HIGH (ref 13.0–17.7)
Immature Grans (Abs): 0.1 10*3/uL (ref 0.0–0.1)
Immature Granulocytes: 1 %
Lymphocytes Absolute: 2 10*3/uL (ref 0.7–3.1)
Lymphs: 33 %
MCH: 30.5 pg (ref 26.6–33.0)
MCHC: 33.3 g/dL (ref 31.5–35.7)
MCV: 92 fL (ref 79–97)
Monocytes Absolute: 0.7 10*3/uL (ref 0.1–0.9)
Monocytes: 11 %
Neutrophils Absolute: 3.2 10*3/uL (ref 1.4–7.0)
Neutrophils: 53 %
Platelets: 215 10*3/uL (ref 150–450)
RBC: 5.84 x10E6/uL — ABNORMAL HIGH (ref 4.14–5.80)
RDW: 11.8 % (ref 11.6–15.4)
WBC: 6.1 10*3/uL (ref 3.4–10.8)

## 2021-05-12 LAB — BAYER DCA HB A1C WAIVED: HB A1C (BAYER DCA - WAIVED): 5.9 % — ABNORMAL HIGH (ref 4.8–5.6)

## 2021-05-12 MED ORDER — GENOTROPIN MINIQUICK 0.2 MG ~~LOC~~ PRSY: 0.2000 mg | PREFILLED_SYRINGE | SUBCUTANEOUS | 1 refills | Status: DC

## 2021-05-12 MED ORDER — TESTOSTERONE CYPIONATE 200 MG/ML IM SOLN: INTRAMUSCULAR | 1 refills | Status: DC

## 2021-05-12 NOTE — Progress Notes (Signed)
Established Patient Office Visit  Subjective:  Patient ID: WEST BOOMERSHINE, male    DOB: [birth year]  Age: 65 y.o. MRN: [id number]  CC:  Chief Complaint  Patient presents with   Prediabetes   Medical Management of Chronic Issues    HPI Spencer Stewart presents for chronic follow up. He reports doing well and has no concerns today.   Prediabetes His a1c was 6.0 at last visit. He continues to eat a healthy diet and exercises regularly.   2. Low testosterone He has been taking supplements as prescribed. Last testosterone level was normal with supplementation.    Past Medical History:  Diagnosis Date   Hyperlipidemia     Past Surgical History:  Procedure Laterality Date   KNEE ARTHROSCOPY Left 2000   TONSILLECTOMY AND ADENOIDECTOMY      Family History  Problem Relation Age of Onset   Stroke Father    Colon cancer Cousin     Social History   Socioeconomic History   Marital status: Married    Spouse name: Not on file   Number of children: Not on file   Years of education: Not on file   Highest education level: Not on file  Occupational History   Not on file  Tobacco Use   Smoking status: Former   Smokeless tobacco: Former  Scientific laboratory technician Use: Never used  Substance and Sexual Activity   Alcohol use: Yes    Comment: twice weekly   Drug use: No   Sexual activity: Not on file  Other Topics Concern   Not on file  Social History Narrative   Not on file   Social Determinants of Health   Financial Resource Strain: Not on file  Food Insecurity: Not on file  Transportation Needs: Not on file  Physical Activity: Not on file  Stress: Not on file  Social Connections: Not on file  Intimate Partner Violence: Not on file    Outpatient Medications Prior to Visit  Medication Sig Dispense Refill   aspirin 81 MG chewable tablet Chew by mouth daily.     GENOTROPIN MINIQUICK 0.2 MG PRSY TAKE AS DIRECTED 56 each 0   Magnesium 400 MG TABS Take by mouth.      testosterone cypionate (DEPOTESTOSTERONE CYPIONATE) 200 MG/ML injection INJECT 1ML IM EVERY 14 DAYS. 10 mL 5   UNABLE TO FIND Med Name: flax seed , vit d, zinc and magnesium and BCAA branch-chain amino acids     cetirizine (ZYRTEC) 10 MG tablet Take 1 tablet (10 mg total) by mouth daily. (Patient not taking: No sig reported) 30 tablet 11   fluticasone (FLONASE) 50 MCG/ACT nasal spray Place 2 sprays into both nostrils daily. (Patient not taking: No sig reported) 16 g 6   No facility-administered medications prior to visit.    Allergies  Allergen Reactions   Codeine    Crestor [Rosuvastatin] Other (See Comments)    Causes myalgia    ROS Review of Systems Negative unless specially indicated above in HPI.   Objective:    Physical Exam Vitals and nursing note reviewed.  Constitutional:      General: He is not in acute distress.    Appearance: Normal appearance. He is not ill-appearing, toxic-appearing or diaphoretic.  Cardiovascular:     Rate and Rhythm: Normal rate and regular rhythm.     Heart sounds: Normal heart sounds. No murmur heard. Pulmonary:     Effort: Pulmonary effort is normal. No respiratory distress.  Breath sounds: Normal breath sounds.  Musculoskeletal:     Right lower leg: No edema.     Left lower leg: No edema.  Skin:    General: Skin is warm and dry.  Neurological:     General: No focal deficit present.     Mental Status: He is alert and oriented to person, place, and time.  Psychiatric:        Mood and Affect: Mood normal.        Behavior: Behavior normal.    BP 118/72   Pulse 71   Temp 98.1 F (36.7 C) (Temporal)   Ht _0  (1.803 m)   Wt 199 lb 4 oz (90.4 kg)   BMI 27.79 kg/m  Wt Readings from Last 3 Encounters:  05/12/21 199 lb 4 oz (90.4 kg)  02/02/21 195 lb (88.5 kg)  09/18/20 198 lb 8 oz (90 kg)     There are no preventive care reminders to display for this patient.  There are no preventive care reminders to display for this  patient.  Lab Results  Component Value Date   TSH 2.000 02/02/2021   Lab Results  Component Value Date   WBC 5.5 02/02/2021   HGB 16.7 02/02/2021   HCT 48.3 02/02/2021   MCV 88 02/02/2021   PLT 198 02/02/2021   Lab Results  Component Value Date   NA 140 02/02/2021   K 4.6 02/02/2021   CO2 25 02/02/2021   GLUCOSE 150 (H) 02/02/2021   BUN 19 02/02/2021   CREATININE 1.14 02/02/2021   BILITOT 0.5 02/02/2021   ALKPHOS 98 02/02/2021   AST 45 (H) 02/02/2021   ALT 58 (H) 02/02/2021   PROT 6.7 02/02/2021   ALBUMIN 4.6 02/02/2021   CALCIUM 9.7 02/02/2021   EGFR 71 02/02/2021   Lab Results  Component Value Date   CHOL 169 02/02/2021   Lab Results  Component Value Date   HDL 40 02/02/2021   Lab Results  Component Value Date   LDLCALC 116 (H) 02/02/2021   Lab Results  Component Value Date   TRIG 70 02/02/2021   Lab Results  Component Value Date   CHOLHDL 4.2 02/02/2021   Lab Results  Component Value Date   HGBA1C 6.0 (H) 02/02/2021      Assessment & Plan:   Spencer Stewart was seen today for prediabetes and medical management of chronic issues.  Diagnoses and all orders for this visit:  Prediabetes A1c 5.9 today. Continue diet and exercise. Labs pending.  -     CBC with Differential/Platelet -     CMP14+EGFR -     Bayer DCA Hb A1c Waived  Hypotestosteronemia Well controlled on current regimen. Refills provided.  -     testosterone cypionate (DEPOTESTOSTERONE CYPIONATE) 200 MG/ML injection; INJECT 1ML IM EVERY 14 DAYS. -     Somatropin (GENOTROPIN MINIQUICK) 0.2 MG PRSY; Inject 0.2 mg into the skin See admin instructions.  Follow-up: Return in about 6 months (around 11/09/2021) for chronic follow up.  The patient indicates understanding of these issues and agrees with the plan.    Gwenlyn Perking, FNP

## 2021-05-12 NOTE — Patient Instructions (Signed)
Hypogonadism, Male ?Male hypogonadism is a condition of having a level of testosterone that is lower than normal. Testosterone is a chemical, or hormone, that is made mainly in the testicles. ?In boys, testosterone is responsible for the development of male characteristics during puberty. These include: ?Making the penis bigger. ?Growing and building the muscles. ?Growing facial hair. ?Deepening the voice. ?In adult men, testosterone is responsible for maintaining: ?An interest in sex and the ability to have sex. ?Muscle mass. ?Sperm production. ?Red blood cell production. ?Bone strength. ?Testosterone also gives men energy and a sense of well-being. ?Testosterone normally decreases as men age and the testicles make less testosterone. Testosterone levels can vary from man to man. Not all men will have signs and symptoms of low testosterone. Weight, alcohol use, medicines, and certain medical conditions can affect a man's testosterone level. ?What are the causes? ?This condition is caused by: ?A natural decrease in testosterone that occurs as a man grows older. This is the main cause of this condition. ?Use of medicines, such as antidepressants, steroids, and opioids. ?Diseases and conditions that affect the testicles or the making of testosterone. These include: ?Injury or damage to the testicles from trauma, cancer, cancer treatment, or infection. ?Diabetes. ?Sleep apnea. ?Genetic conditions that men are born with. ?Disease of the pituitary gland. This gland is in the brain. It produces hormones. ?Obesity. ?Metabolic syndrome. This is a group of diseases that affect blood pressure, blood sugar, cholesterol, and belly fat. ?HIV or AIDS. ?Alcohol abuse. ?Kidney failure. ?Other long-term or chronic diseases. ?What are the signs or symptoms? ?Common symptoms of this condition include: ?Loss of interest in sex (low sex drive). ?Inability to have or maintain an erection (erectile dysfunction). ?Feeling tired  (fatigue). ?Mood changes, like irritability or depression. ?Loss of muscle and body hair. ?Infertility. ?Large breasts. ?Weight gain (obesity). ?How is this diagnosed? ?Your health care provider can diagnose hypogonadism based on: ?Your signs and symptoms. ?A physical exam to check your testosterone levels. This includes blood tests. Testosterone levels can change throughout the day. Levels are highest in the morning. You may need to have repeat blood tests before getting a diagnosis of hypogonadism. ?Depending on your medical history and test results, your health care provider may also do other tests to find the cause of low testosterone. ?How is this treated? ?This condition is treated with testosterone replacement therapy. Testosterone can be given by: ?Injection or through pellets inserted under the skin. ?Gels or patches placed on the skin or in the mouth. ?Testosterone therapy is not for everyone. It has risks and side effects. Your health care provider will consider your medical history, your risk for prostate cancer, your age, and your symptoms before putting you on testosterone replacement therapy. ?Follow these instructions at home: ?Take over-the-counter and prescription medicines only as told by your health care provider. ?Eat foods that are high in fiber, such as beans, whole grains, and fresh fruits and vegetables. Limit foods that are high in fat and processed sugars, such as fried or sweet foods. ?If you drink alcohol: ?Limit how much you have to 0-2 drinks a day. ?Know how much alcohol is in your drink. In the [place] one drink equals one 12 oz bottle of beer (355 mL), one 5 oz glass of wine (148 mL), or one 1? oz glass of hard liquor (44 mL). ?Return to your normal activities as told by your health care provider. Ask your health care provider what activities are safe for you. ?Keep all follow-up   visits. This is important. ?Contact a health care provider if: ?You have any of the signs or symptoms of  low testosterone. ?You have any side effects from testosterone therapy. ?Summary ?Male hypogonadism is a condition of having a level of testosterone that is lower than normal. ?The natural drop in testosterone production that occurs with age is the most common cause of this condition. ?Low testosterone can also be caused by many diseases and conditions that affect the testicles and the making of testosterone. ?This condition is treated with testosterone replacement therapy. ?There are risks and side effects of testosterone therapy. Your health care provider will consider your age, medical history, symptoms, and risks for prostate cancer before putting you on testosterone therapy. ?This information is not intended to replace advice given to you by your health care provider. Make sure you discuss any questions you have with your health care provider. ?Document Revised: 02/20/2020 Document Reviewed: 02/20/2020 ?Elsevier Patient Education ? 2022 [employer] Inc. ? ?

## 2021-06-03 ENCOUNTER — Encounter: Payer: Medicare Other | Admitting: Family Medicine

## 2021-08-11 ENCOUNTER — Encounter: Payer: Self-pay | Admitting: *Deleted

## 2021-08-26 ENCOUNTER — Encounter: Payer: Self-pay | Admitting: Family

## 2021-08-26 ENCOUNTER — Ambulatory Visit (INDEPENDENT_AMBULATORY_CARE_PROVIDER_SITE_OTHER): Payer: Medicare Other | Admitting: Family

## 2021-08-26 VITALS — BP 121/76 | HR 103 | Temp 100.0°F | Ht 71.0 in | Wt 196.2 lb

## 2021-08-26 DIAGNOSIS — J019 Acute sinusitis, unspecified: Secondary | ICD-10-CM | POA: Diagnosis not present

## 2021-08-26 MED ORDER — AMOXICILLIN-POT CLAVULANATE 875-125 MG PO TABS: 1.0000 | ORAL_TABLET | Freq: Two times a day (BID) | ORAL | 0 refills | Status: DC

## 2021-08-26 NOTE — Patient Instructions (Signed)

## 2021-08-26 NOTE — Progress Notes (Signed)
Subjective:    Patient ID: Spencer Stewart, male    DOB: [birth year], 66 y.o.   MRN: [id number]  Chief Complaint  Patient presents with   Fatigue    Started Friday at home COVID 2 test -   Sinus Problem    Over the last 3 days teeth sore. Blood when blows a lot of stuff come out    Fever    Since last Friday.    Pt presents to the office today with fatigue, achy, and sinus congestion. Has taken two COVID test that were negative.  Sinus Problem This is a new problem. The current episode started 1 to 4 weeks ago. The problem has been gradually worsening since onset. The maximum temperature recorded prior to his arrival was 100.4 - 100.9 F. His pain is at a severity of 5/10. The pain is mild. Associated symptoms include congestion, coughing, headaches, shortness of breath, sinus pressure (eyes) and a sore throat. Pertinent negatives include no ear pain or sneezing. (Fatigue ) Past treatments include oral decongestants. The treatment provided mild relief.  Fever  Associated symptoms include congestion, coughing, headaches and a sore throat. Pertinent negatives include no ear pain.     Review of Systems  Constitutional:  Positive for fever.  HENT:  Positive for congestion, sinus pressure (eyes) and sore throat. Negative for ear pain and sneezing.   Respiratory:  Positive for cough and shortness of breath.   Neurological:  Positive for headaches.  All other systems reviewed and are negative.     Objective:   Physical Exam Vitals reviewed.  Constitutional:      General: He is not in acute distress.    Appearance: He is well-developed.  HENT:     Head: Normocephalic.     Right Ear: Tympanic membrane normal.     Left Ear: Tympanic membrane normal.     Nose:     Right Sinus: Maxillary sinus tenderness and frontal sinus tenderness present.     Left Sinus: Maxillary sinus tenderness and frontal sinus tenderness present.  Eyes:     General:        Right eye: No discharge.        Left  eye: No discharge.     Pupils: Pupils are equal, round, and reactive to light.  Neck:     Thyroid: No thyromegaly.  Cardiovascular:     Rate and Rhythm: Normal rate and regular rhythm.     Heart sounds: Normal heart sounds. No murmur heard. Pulmonary:     Effort: Pulmonary effort is normal. No respiratory distress.     Breath sounds: Normal breath sounds. No wheezing.  Abdominal:     General: Bowel sounds are normal. There is no distension.     Palpations: Abdomen is soft.     Tenderness: There is no abdominal tenderness.  Musculoskeletal:        General: No tenderness. Normal range of motion.     Cervical back: Normal range of motion and neck supple.  Skin:    General: Skin is warm and dry.     Findings: No erythema or rash.  Neurological:     Mental Status: He is alert and oriented to person, place, and time.     Cranial Nerves: No cranial nerve deficit.     Deep Tendon Reflexes: Reflexes are normal and symmetric.  Psychiatric:        Behavior: Behavior normal.        Thought Content: Thought content normal.  Judgment: Judgment normal.    BP 121/76    Pulse (!) 103    Temp 100 F (37.8 C) (Temporal)    Ht 5\' 11"  (1.803 m)    Wt 196 lb 3.2 oz (89 kg)    SpO2 95%    BMI 27.36 kg/m        Assessment & Plan:  DAKARRI COMPHER comes in today with chief complaint of Fatigue (Started Friday at home COVID 2 test -), Sinus Problem (Over the last 3 days teeth sore. Blood when blows a lot of stuff come out ), and Fever (Since last Friday. )   Diagnosis and orders addressed:  1. Acute sinusitis, recurrence not specified, unspecified location - Take meds as prescribed - Use a cool mist humidifier  -Use saline nose sprays frequently -Force fluids -For any cough or congestion  Use plain Mucinex- regular strength or max strength is fine -For fever or aces or pains- take tylenol or ibuprofen. -Throat lozenges if help -New toothbrush in 3 days Follow up if symptoms worsen or  do not improve  - amoxicillin-clavulanate (AUGMENTIN) 875-125 MG tablet; Take 1 tablet by mouth 2 (two) times daily.  Dispense: 14 tablet; Refill: 0   Evelina Dun, FNP

## 2021-08-27 ENCOUNTER — Ambulatory Visit: Payer: Medicare Other | Admitting: Family Medicine

## 2021-09-20 ENCOUNTER — Encounter: Payer: Medicare Other | Admitting: Family Medicine

## 2021-09-28 ENCOUNTER — Encounter: Payer: Self-pay | Admitting: Family Medicine

## 2021-09-28 ENCOUNTER — Ambulatory Visit (INDEPENDENT_AMBULATORY_CARE_PROVIDER_SITE_OTHER): Payer: Medicare Other | Admitting: Family Medicine

## 2021-09-28 VITALS — BP 129/78 | HR 65 | Temp 97.0°F | Ht 70.0 in | Wt 200.8 lb

## 2021-09-28 DIAGNOSIS — Z0000 Encounter for general adult medical examination without abnormal findings: Secondary | ICD-10-CM | POA: Diagnosis not present

## 2021-09-28 NOTE — Progress Notes (Signed)
? ?Subjective:  ? Spencer Stewart is a 66 y.o. male who presents for a Welcome to Medicare exam.  ? ?Review of Systems: ?Review of Systems  ?Constitutional:  Negative for chills, diaphoresis, fever, malaise/fatigue and weight loss.  ?HENT:  Negative for hearing loss and sore throat.   ?Eyes:  Negative for blurred vision, double vision and photophobia.  ?Respiratory:  Negative for cough, shortness of breath and wheezing.   ?Cardiovascular:  Negative for chest pain, palpitations, orthopnea, claudication, leg swelling and PND.  ?Gastrointestinal:  Negative for abdominal pain, blood in stool, constipation, diarrhea, melena and vomiting.  ?Genitourinary:  Negative for dysuria, flank pain, frequency, hematuria and urgency.  ?Musculoskeletal:  Negative for falls and myalgias.  ?Skin:  Negative for rash.  ?Neurological:  Negative for dizziness, sensory change, speech change, focal weakness, seizures, loss of consciousness, weakness and headaches.  ?Psychiatric/Behavioral:  Negative for depression, hallucinations, memory loss, substance abuse and suicidal ideas. The patient is not nervous/anxious and does not have insomnia.   ? ?  ? ?   ?Objective:  ?  ?Today's Vitals  ? 09/28/21 1053 09/28/21 1056  ?BP: (!) 142/92 129/78  ?Pulse: 66 65  ?Temp: (!) 97 ?F (36.1 ?C)   ?TempSrc: Oral   ?SpO2: 98%   ?Weight: 200 lb 12.8 oz (91.1 kg)   ?Height: 5\' 10"  (1.778 m)   ? ?Body mass index is 28.81 kg/m?. ? ?Medications ?Outpatient Encounter Medications as of 09/28/2021  ?Medication Sig  ? aspirin 81 MG chewable tablet Chew by mouth daily.  ? cetirizine (ZYRTEC) 10 MG tablet Take 1 tablet (10 mg total) by mouth daily.  ? fluticasone (FLONASE) 50 MCG/ACT nasal spray Place 2 sprays into both nostrils daily.  ? Magnesium 400 MG TABS Take by mouth.  ? Somatropin (GENOTROPIN MINIQUICK) 0.2 MG PRSY Inject 0.2 mg into the skin See admin instructions.  ? testosterone cypionate (DEPOTESTOSTERONE CYPIONATE) 200 MG/ML injection INJECT 09/30/2021 IM EVERY 14  DAYS.  ? UNABLE TO FIND Med Name: flax seed , vit d, zinc and magnesium and BCAA branch-chain amino acids  ? [DISCONTINUED] amoxicillin-clavulanate (AUGMENTIN) 875-125 MG tablet Take 1 tablet by mouth 2 (two) times daily.  ? ?No facility-administered encounter medications on file as of 09/28/2021.  ?  ? ?History: ?Past Medical History:  ?Diagnosis Date  ? Hyperlipidemia   ? ?Past Surgical History:  ?Procedure Laterality Date  ? KNEE ARTHROSCOPY Left 2000  ? TONSILLECTOMY AND ADENOIDECTOMY    ?  ?Family History  ?Problem Relation Age of Onset  ? Stroke Father   ? Colon cancer Cousin   ? ?Social History  ? ?Occupational History  ? Not on file  ?Tobacco Use  ? Smoking status: Former  ? Smokeless tobacco: Former  ?Vaping Use  ? Vaping Use: Never used  ?Substance and Sexual Activity  ? Alcohol use: Yes  ?  Comment: twice weekly  ? Drug use: No  ? Sexual activity: Not on file  ? ?Tobacco Counseling ?Counseling given: No ? ? ?Immunizations and Health Maintenance ?Immunization History  ?Administered Date(s) Administered  ? Tdap 02/23/2011  ? ?There are no preventive care reminders to display for this patient. ? ?Activities of Daily Living ? ?  09/28/2021  ? 11:21 AM  ?In your present state of health, do you have any difficulty performing the following activities:  ?Hearing? 1  ?Vision? 0  ?Difficulty concentrating or making decisions? 0  ?Walking or climbing stairs? 0  ?Dressing or bathing? 0  ?Doing errands, shopping?  0  ?Preparing Food and eating ? N  ?Using the Toilet? N  ?In the past six months, have you accidently leaked urine? N  ?Do you have problems with loss of bowel control? N  ?Managing your Medications? N  ?Managing your Finances? N  ?Housekeeping or managing your Housekeeping? N  ? ? ?Physical Exam: Alert and oriented x3, respirations unlabored, no distress (optional), or other factors deemed appropriate based on the beneficiary's medical and social history and current clinical standards. ? ?Advanced  Directives: ?  ?   ?Assessment:  ?  ?This is a routine wellness  examination for this patient .  ? ?Vision/Hearing screen ?No results found. Patient has eye doctor ? ?Dietary issues and exercise activities discussed:  ?  ? ? Goals   ? ?  physical activity   ?  Increase physical activity by walking 3x a week ?  ? ?  ?  ?Depression Screen ? ?  09/28/2021  ? 10:52 AM 05/12/2021  ? 11:23 AM 02/02/2021  ?  8:34 AM 09/18/2020  ?  8:43 AM  ?PHQ 2/9 Scores  ?PHQ - 2 Score 0 0 0 0  ?PHQ- 9 Score 0 1 1   ?  ? ?Fall Risk ? ?  09/28/2021  ? 10:51 AM  ?Fall Risk   ?Falls in the past year? 0  ?Number falls in past yr: 0  ?Injury with Fall? 0  ?Risk for fall due to : No Fall Risks  ?Follow up Falls evaluation completed  ? ? ?Cognitive Function ? ?  09/28/2021  ? 10:57 AM  ?MMSE - Mini Mental State Exam  ?Orientation to time 5  ?Orientation to Place 5  ?Registration 3  ?Attention/ Calculation 5  ?Recall 3  ?Language- name 2 objects 2  ?Language- repeat 1  ?Language- follow 3 step command 3  ?Language- read & follow direction 1  ?Write a sentence 1  ?Copy design 1  ?Total score 30  ? ?  ?  ? ?Patient Care Team: ?Gabriel Earing, FNP as PCP - General (Family Medicine) ? ?   ?Plan:  ? Declined EKG today. Discussed diet and exercise. Declines shingrix, flu, Covid, Tdap, and pneumonia vaccines today.  ? ?I have personally reviewed and noted the following in the patient?s chart:  ? ?Medical and social history ?Use of alcohol, tobacco or illicit drugs  ?Current medications and supplements ?Functional ability and status ?Nutritional status ?Physical activity ?Advanced directives ?List of other physicians ?Hospitalizations, surgeries, and ER visits in previous 12 months ?Vitals ?Screenings to include cognitive, depression, and falls ?Referrals and appointments ? ?In addition, I have reviewed and discussed with patient certain preventive protocols, quality metrics, and best practice recommendations. A written personalized care plan for preventive  services as well as general preventive health recommendations were provided to patient. ?  ? ?Gabriel Earing, FNP 09/28/2021 ? ? ? ? ? ?

## 2021-11-09 ENCOUNTER — Ambulatory Visit: Payer: Medicare Other | Admitting: Family Medicine

## 2021-11-15 ENCOUNTER — Ambulatory Visit: Payer: Medicare Other | Admitting: Family Medicine

## 2021-11-22 ENCOUNTER — Ambulatory Visit (INDEPENDENT_AMBULATORY_CARE_PROVIDER_SITE_OTHER): Payer: Medicare Other | Admitting: Family Medicine

## 2021-11-22 ENCOUNTER — Encounter: Payer: Self-pay | Admitting: Family Medicine

## 2021-11-22 VITALS — BP 142/77 | HR 72 | Temp 98.2°F | Ht 70.0 in | Wt 199.4 lb

## 2021-11-22 DIAGNOSIS — E782 Mixed hyperlipidemia: Secondary | ICD-10-CM | POA: Diagnosis not present

## 2021-11-22 DIAGNOSIS — R7303 Prediabetes: Secondary | ICD-10-CM | POA: Diagnosis not present

## 2021-11-22 DIAGNOSIS — M545 Low back pain, unspecified: Secondary | ICD-10-CM

## 2021-11-22 DIAGNOSIS — E349 Endocrine disorder, unspecified: Secondary | ICD-10-CM | POA: Diagnosis not present

## 2021-11-22 DIAGNOSIS — G8929 Other chronic pain: Secondary | ICD-10-CM

## 2021-11-22 LAB — BAYER DCA HB A1C WAIVED: HB A1C (BAYER DCA - WAIVED): 5.6 % (ref 4.8–5.6)

## 2021-11-22 MED ORDER — TESTOSTERONE CYPIONATE 200 MG/ML IM SOLN: INTRAMUSCULAR | 1 refills | Status: DC

## 2021-11-22 MED ORDER — GENOTROPIN MINIQUICK 0.2 MG ~~LOC~~ PRSY: 0.2000 mg | PREFILLED_SYRINGE | SUBCUTANEOUS | 1 refills | Status: DC

## 2021-11-22 NOTE — Progress Notes (Signed)
Established Patient Office Visit  Subjective:  Patient ID: Spencer Stewart, male    DOB: 15-Aug-1955  Age: 66 y.o. MRN: [id number]  CC:  Chief Complaint  Patient presents with   Medical Management of Chronic Issues   Prediabetes    HPI Spencer Stewart presents for chronic follow up. He reports doing well and has no concerns today.   Prediabetes/Cholesterol His a1c was 5.9 at last visit. He continues to eat a healthy diet and exercises regularly.   2. Low testosterone He has been taking supplements as prescribed. Last testosterone level was normal with supplementation.   3. Chronic back pain He has seen neurosurgery. They recommended conservative treatment until pain is not longer tolerable. He has been having a flare for the last few weeks. He has been taking tylenol and ibuprofen. He has also starting going back to the chiropractor. Denies saddle anesthesia, fever, changes in gait or bowel or bladder control. Denies numbness or tingling.   Past Medical History:  Diagnosis Date   Hyperlipidemia     Past Surgical History:  Procedure Laterality Date   KNEE ARTHROSCOPY Left 2000   TONSILLECTOMY AND ADENOIDECTOMY      Family History  Problem Relation Age of Onset   Stroke Father    Colon cancer Cousin     Social History   Socioeconomic History   Marital status: Married    Spouse name: Not on file   Number of children: Not on file   Years of education: Not on file   Highest education level: Not on file  Occupational History   Not on file  Tobacco Use   Smoking status: Former   Smokeless tobacco: Former  Scientific laboratory technician Use: Never used  Substance and Sexual Activity   Alcohol use: Yes    Comment: twice weekly   Drug use: No   Sexual activity: Not on file  Other Topics Concern   Not on file  Social History Narrative   Not on file   Social Determinants of Health   Financial Resource Strain: Low Risk    Difficulty of Paying Living Expenses: Not hard at all   Food Insecurity: No Food Insecurity   Worried About Charity fundraiser in the Last Year: Never true   Arboriculturist in the Last Year: Never true  Transportation Needs: No Transportation Needs   Lack of Transportation (Medical): No   Lack of Transportation (Non-Medical): No  Physical Activity: Sufficiently Active   Days of Exercise per Week: 5 days   Minutes of Exercise per Session: 60 min  Stress: No Stress Concern Present   Feeling of Stress : Not at all  Social Connections: Socially Integrated   Frequency of Communication with Friends and Family: More than three times a week   Frequency of Social Gatherings with Friends and Family: Three times a week   Attends Religious Services: More than 4 times per year   Active Member of Clubs or Organizations: Yes   Attends Music therapist: More than 4 times per year   Marital Status: Married  Human resources officer Violence: Not At Risk   Fear of Current or Ex-Partner: No   Emotionally Abused: No   Physically Abused: No   Sexually Abused: No    Outpatient Medications Prior to Visit  Medication Sig Dispense Refill   aspirin 81 MG chewable tablet Chew by mouth daily.     cetirizine (ZYRTEC) 10 MG tablet Take 1 tablet (  10 mg total) by mouth daily. 30 tablet 11   fluticasone (FLONASE) 50 MCG/ACT nasal spray Place 2 sprays into both nostrils daily. 16 g 6   Magnesium 400 MG TABS Take by mouth.     UNABLE TO FIND Med Name: flax seed , vit d, zinc and magnesium and BCAA branch-chain amino acids     Somatropin (GENOTROPIN MINIQUICK) 0.2 MG PRSY Inject 0.2 mg into the skin See admin instructions. 56 each 1   testosterone cypionate (DEPOTESTOSTERONE CYPIONATE) 200 MG/ML injection INJECT 1ML IM EVERY 14 DAYS. 10 mL 1   No facility-administered medications prior to visit.    Allergies  Allergen Reactions   Codeine    Crestor [Rosuvastatin] Other (See Comments)    Causes myalgia    ROS Review of Systems Negative unless specially  indicated above in HPI.   Objective:    Physical Exam Vitals and nursing note reviewed.  Constitutional:      General: He is not in acute distress.    Appearance: Normal appearance. He is not ill-appearing, toxic-appearing or diaphoretic.  Cardiovascular:     Rate and Rhythm: Normal rate and regular rhythm.     Heart sounds: Normal heart sounds. No murmur heard. Pulmonary:     Effort: Pulmonary effort is normal. No respiratory distress.     Breath sounds: Normal breath sounds.  Musculoskeletal:     Right lower leg: No edema.     Left lower leg: No edema.  Skin:    General: Skin is warm and dry.  Neurological:     General: No focal deficit present.     Mental Status: He is alert and oriented to person, place, and time.  Psychiatric:        Mood and Affect: Mood normal.        Behavior: Behavior normal.    BP (!) 142/77   Pulse 72   Temp 98.2 F (36.8 C) (Temporal)   Ht '5\' 10"'  (1.778 m)   Wt 199 lb 6 oz (90.4 kg)   SpO2 96%   BMI 28.61 kg/m  Wt Readings from Last 3 Encounters:  11/22/21 199 lb 6 oz (90.4 kg)  09/28/21 200 lb 12.8 oz (91.1 kg)  08/26/21 196 lb 3.2 oz (89 kg)     There are no preventive care reminders to display for this patient.  There are no preventive care reminders to display for this patient.  Lab Results  Component Value Date   TSH 2.000 02/02/2021   Lab Results  Component Value Date   WBC 6.1 05/12/2021   HGB 17.8 (H) 05/12/2021   HCT 53.5 (H) 05/12/2021   MCV 92 05/12/2021   PLT 215 05/12/2021   Lab Results  Component Value Date   NA 137 05/12/2021   K 5.4 (H) 05/12/2021   CO2 28 05/12/2021   GLUCOSE 210 (H) 05/12/2021   BUN 18 05/12/2021   CREATININE 1.15 05/12/2021   BILITOT 0.5 05/12/2021   ALKPHOS 109 05/12/2021   AST 34 05/12/2021   ALT 48 (H) 05/12/2021   PROT 7.2 05/12/2021   ALBUMIN 4.5 05/12/2021   CALCIUM 9.4 05/12/2021   EGFR 71 05/12/2021   Lab Results  Component Value Date   CHOL 169 02/02/2021   Lab  Results  Component Value Date   HDL 40 02/02/2021   Lab Results  Component Value Date   LDLCALC 116 (H) 02/02/2021   Lab Results  Component Value Date   TRIG 70 02/02/2021   Lab  Results  Component Value Date   CHOLHDL 4.2 02/02/2021   Lab Results  Component Value Date   HGBA1C 5.9 (H) 05/12/2021      Assessment & Plan:   Trentin was seen today for medical management of chronic issues and prediabetes.  Diagnoses and all orders for this visit:  Prediabetes A1c at 5.6 today. Well controlled with diet and exercise.  -     Bayer DCA Hb A1c Waived  Mixed hyperlipidemia Diet and exercise. Labs pending.  -     CBC with Differential/Platelet -     CMP14+EGFR -     Lipid panel  Hypotestosteronemia Labs pending. Refills provided. PDMP reviewed, no red flags.  -     testosterone cypionate (DEPOTESTOSTERONE CYPIONATE) 200 MG/ML injection; INJECT 1ML IM EVERY 14 DAYS. -     Somatropin (GENOTROPIN MINIQUICK) 0.2 MG PRSY; Inject 0.2 mg into the skin See admin instructions. -     Testosterone,Free and Total  Chronic low back pain without sciatica, unspecified back pain laterality Established with neurosurgery. Discussed mobic or voltaren pending labs. Discussed PT referral if he is interested. Handout with exercises given.    Follow-up: Return in about 6 months (around 05/25/2022) for CPE.  The patient indicates understanding of these issues and agrees with the plan.    Gwenlyn Perking, FNP

## 2021-11-22 NOTE — Patient Instructions (Signed)

## 2021-11-23 LAB — CMP14+EGFR
ALT: 95 IU/L — ABNORMAL HIGH (ref 0–44)
AST: 64 IU/L — ABNORMAL HIGH (ref 0–40)
Albumin/Globulin Ratio: 1.9 (ref 1.2–2.2)
Albumin: 4.5 g/dL (ref 3.8–4.8)
Alkaline Phosphatase: 122 IU/L — ABNORMAL HIGH (ref 44–121)
BUN/Creatinine Ratio: 18 (ref 10–24)
BUN: 21 mg/dL (ref 8–27)
Bilirubin Total: 0.7 mg/dL (ref 0.0–1.2)
CO2: 21 mmol/L (ref 20–29)
Calcium: 9.3 mg/dL (ref 8.6–10.2)
Chloride: 104 mmol/L (ref 96–106)
Creatinine, Ser: 1.2 mg/dL (ref 0.76–1.27)
Globulin, Total: 2.4 g/dL (ref 1.5–4.5)
Glucose: 147 mg/dL — ABNORMAL HIGH (ref 70–99)
Potassium: 4.9 mmol/L (ref 3.5–5.2)
Sodium: 141 mmol/L (ref 134–144)
Total Protein: 6.9 g/dL (ref 6.0–8.5)
eGFR: 67 mL/min/{1.73_m2} (ref >59)

## 2021-11-23 LAB — LIPID PANEL
Chol/HDL Ratio: 4.7 ratio (ref 0.0–5.0)
Cholesterol, Total: 173 mg/dL (ref 100–199)
HDL: 37 mg/dL — ABNORMAL LOW (ref >39)
LDL Chol Calc (NIH): 122 mg/dL — ABNORMAL HIGH (ref 0–99)
Triglycerides: 75 mg/dL (ref 0–149)
VLDL Cholesterol Cal: 14 mg/dL (ref 5–40)

## 2021-11-23 LAB — CBC WITH DIFFERENTIAL/PLATELET
Basophils Absolute: 0.1 10*3/uL (ref 0.0–0.2)
Basos: 1 %
EOS (ABSOLUTE): 0.1 10*3/uL (ref 0.0–0.4)
Eos: 1 %
Hematocrit: 52.4 % — ABNORMAL HIGH (ref 37.5–51.0)
Hemoglobin: 18.1 g/dL — ABNORMAL HIGH (ref 13.0–17.7)
Immature Grans (Abs): 0 10*3/uL (ref 0.0–0.1)
Immature Granulocytes: 1 %
Lymphocytes Absolute: 1.7 10*3/uL (ref 0.7–3.1)
Lymphs: 30 %
MCH: 30.5 pg (ref 26.6–33.0)
MCHC: 34.5 g/dL (ref 31.5–35.7)
MCV: 88 fL (ref 79–97)
Monocytes Absolute: 0.6 10*3/uL (ref 0.1–0.9)
Monocytes: 10 %
Neutrophils Absolute: 3.1 10*3/uL (ref 1.4–7.0)
Neutrophils: 57 %
Platelets: 247 10*3/uL (ref 150–450)
RBC: 5.93 x10E6/uL — ABNORMAL HIGH (ref 4.14–5.80)
RDW: 12.6 % (ref 11.6–15.4)
WBC: 5.5 10*3/uL (ref 3.4–10.8)

## 2021-11-24 LAB — SPECIMEN STATUS REPORT

## 2021-11-24 LAB — TESTOSTERONE,FREE AND TOTAL
Testosterone, Free: 8.8 pg/mL (ref 6.6–18.1)
Testosterone: 203 ng/dL — ABNORMAL LOW (ref 264–916)

## 2021-11-25 ENCOUNTER — Other Ambulatory Visit: Payer: Self-pay | Admitting: Family Medicine

## 2021-11-25 DIAGNOSIS — M545 Low back pain, unspecified: Secondary | ICD-10-CM

## 2021-11-25 MED ORDER — MELOXICAM 15 MG PO TABS: 15.0000 mg | ORAL_TABLET | Freq: Every day | ORAL | 2 refills | Status: DC

## 2022-02-21 ENCOUNTER — Ambulatory Visit: Payer: Medicare Other | Admitting: Family Medicine

## 2022-02-22 ENCOUNTER — Encounter: Payer: Self-pay | Admitting: Family Medicine

## 2022-05-09 ENCOUNTER — Other Ambulatory Visit: Payer: Self-pay | Admitting: Family Medicine

## 2022-05-09 DIAGNOSIS — E349 Endocrine disorder, unspecified: Secondary | ICD-10-CM

## 2022-05-30 ENCOUNTER — Other Ambulatory Visit: Payer: Self-pay | Admitting: Family Medicine

## 2022-05-30 DIAGNOSIS — E349 Endocrine disorder, unspecified: Secondary | ICD-10-CM

## 2022-06-01 ENCOUNTER — Encounter: Payer: Self-pay | Admitting: Family Medicine

## 2022-06-01 ENCOUNTER — Ambulatory Visit (INDEPENDENT_AMBULATORY_CARE_PROVIDER_SITE_OTHER): Payer: Medicare Other | Admitting: Family Medicine

## 2022-06-01 VITALS — BP 136/80 | HR 65 | Temp 98.3°F | Ht 70.0 in | Wt 198.2 lb

## 2022-06-01 DIAGNOSIS — I700 Atherosclerosis of aorta: Secondary | ICD-10-CM

## 2022-06-01 DIAGNOSIS — Z23 Encounter for immunization: Secondary | ICD-10-CM

## 2022-06-01 DIAGNOSIS — E782 Mixed hyperlipidemia: Secondary | ICD-10-CM | POA: Diagnosis not present

## 2022-06-01 DIAGNOSIS — G8929 Other chronic pain: Secondary | ICD-10-CM

## 2022-06-01 DIAGNOSIS — M545 Low back pain, unspecified: Secondary | ICD-10-CM

## 2022-06-01 DIAGNOSIS — R7303 Prediabetes: Secondary | ICD-10-CM | POA: Diagnosis not present

## 2022-06-01 DIAGNOSIS — R748 Abnormal levels of other serum enzymes: Secondary | ICD-10-CM

## 2022-06-01 DIAGNOSIS — E349 Endocrine disorder, unspecified: Secondary | ICD-10-CM

## 2022-06-01 NOTE — Progress Notes (Signed)
Established Patient Office Visit  Subjective:  Patient ID: Spencer Stewart, male    DOB: 10-Apr-1956  Age: 66 y.o. MRN: [id number]  CC:  Chief Complaint  Patient presents with   Medical Management of Chronic Issues   hypotestosteronemia    HPI Spencer Stewart presents for chronic follow up. He reports doing well and has no concerns today.   Prediabetes/Cholesterol His a1c was 5.6 at last visit. He continues to eat a healthy diet and exercises regularly.   2. Low testosterone He has been taking supplements as prescribed. Last testosterone level was normal with supplementation. He does his injection every other week. He has at least 2 doses left. His last testosterone level was low.   3. Chronic low back pain He has established with neurosurgery. They have recommended putting of surgery as long as possible. He reports pain flares intermittently based on activity. He has been seeing a chiropractor and stretching with improvement.   He has had an eye exam a little over a year again. He has a appt scheduled. He receives regular dental care.   Declines PNA and flu vaccine today.  He would like to get shingrix.    Past Medical History:  Diagnosis Date   Hyperlipidemia     Past Surgical History:  Procedure Laterality Date   KNEE ARTHROSCOPY Left 2000   TONSILLECTOMY AND ADENOIDECTOMY      Family History  Problem Relation Age of Onset   Stroke Father    Colon cancer Cousin     Social History   Socioeconomic History   Marital status: Married    Spouse name: Not on file   Number of children: Not on file   Years of education: Not on file   Highest education level: Not on file  Occupational History   Not on file  Tobacco Use   Smoking status: Former   Smokeless tobacco: Former  Scientific laboratory technician Use: Never used  Substance and Sexual Activity   Alcohol use: Yes    Comment: twice weekly   Drug use: No   Sexual activity: Not on file  Other Topics Concern   Not on  file  Social History Narrative   Not on file   Social Determinants of Health   Financial Resource Strain: Low Risk  (09/28/2021)   Overall Financial Resource Strain (CARDIA)    Difficulty of Paying Living Expenses: Not hard at all  Food Insecurity: No Food Insecurity (09/28/2021)   Hunger Vital Sign    Worried About Running Out of Food in the Last Year: Never true    [place] in the Last Year: Never true  Transportation Needs: No Transportation Needs (09/28/2021)   PRAPARE - Hydrologist (Medical): No    Lack of Transportation (Non-Medical): No  Physical Activity: Sufficiently Active (09/28/2021)   Exercise Vital Sign    Days of Exercise per Week: 5 days    Minutes of Exercise per Session: 60 min  Stress: No Stress Concern Present (09/28/2021)   [place]    Feeling of Stress : Not at all  Social Connections: [place] (09/28/2021)   Social Connection and Isolation Panel [NHANES]    Frequency of Communication with Friends and Family: More than three times a week    Frequency of Social Gatherings with Friends and Family: Three times a week    Attends Religious Services: More than 4 times  per year    Active Member of Clubs or Organizations: Yes    Attends Archivist Meetings: More than 4 times per year    Marital Status: Married  Human resources officer Violence: Not At Risk (09/28/2021)   Humiliation, Afraid, Rape, and Kick questionnaire    Fear of Current or Ex-Partner: No    Emotionally Abused: No    Physically Abused: No    Sexually Abused: No    Outpatient Medications Prior to Visit  Medication Sig Dispense Refill   aspirin 81 MG chewable tablet Chew by mouth daily.     cetirizine (ZYRTEC) 10 MG tablet Take 1 tablet (10 mg total) by mouth daily. 30 tablet 11   fluticasone (FLONASE) 50 MCG/ACT nasal spray Place 2 sprays into both nostrils daily. 16 g 6   Magnesium  400 MG TABS Take by mouth.     Somatropin (GENOTROPIN MINIQUICK) 0.2 MG PRSY Inject 0.2 mg into the skin See admin instructions. 56 each 1   testosterone cypionate (DEPOTESTOSTERONE CYPIONATE) 200 MG/ML injection INJECT 1ML EVERY 14 DAYS 2 mL 0   UNABLE TO FIND Med Name: flax seed , vit d, zinc and magnesium and BCAA branch-chain amino acids     meloxicam (MOBIC) 15 MG tablet Take 1 tablet (15 mg total) by mouth daily. (Patient not taking: Reported on 06/01/2022) 30 tablet 2   No facility-administered medications prior to visit.    Allergies  Allergen Reactions   Codeine    Crestor [Rosuvastatin] Other (See Comments)    Causes myalgia    ROS Review of Systems Negative unless specially indicated above in HPI.   Objective:    Physical Exam Vitals and nursing note reviewed.  Constitutional:      General: He is not in acute distress.    Appearance: Normal appearance. He is not ill-appearing, toxic-appearing or diaphoretic.  HENT:     Head: Normocephalic and atraumatic.     Right Ear: Tympanic membrane, ear canal and external ear normal.     Left Ear: Tympanic membrane, ear canal and external ear normal.     Nose: Nose normal.     Mouth/Throat:     Mouth: Mucous membranes are moist.     Pharynx: Oropharynx is clear.  Eyes:     General:        Right eye: No discharge.        Left eye: No discharge.     Extraocular Movements: Extraocular movements intact.     Conjunctiva/sclera: Conjunctivae normal.     Pupils: Pupils are equal, round, and reactive to light.  Neck:     Thyroid: No thyroid mass, thyromegaly or thyroid tenderness.     Vascular: No carotid bruit.  Cardiovascular:     Rate and Rhythm: Normal rate and regular rhythm.     Heart sounds: Normal heart sounds. No murmur heard. Pulmonary:     Effort: Pulmonary effort is normal. No respiratory distress.     Breath sounds: Normal breath sounds.  Abdominal:     General: Bowel sounds are normal. There is no distension.      Palpations: Abdomen is soft.     Tenderness: There is no abdominal tenderness. There is no guarding or rebound.  Musculoskeletal:        General: No swelling.     Cervical back: Neck supple.     Right lower leg: No edema.     Left lower leg: No edema.  Skin:    General: Skin is  warm and dry.     Coloration: Skin is not jaundiced.  Neurological:     General: No focal deficit present.     Mental Status: He is alert and oriented to person, place, and time.     Cranial Nerves: No cranial nerve deficit.     Motor: No weakness.     Coordination: Coordination normal.     Gait: Gait normal.  Psychiatric:        Mood and Affect: Mood normal.        Behavior: Behavior normal.        Thought Content: Thought content normal.        Judgment: Judgment normal.     BP 136/80   Pulse 65   Temp 98.3 F (36.8 C) (Temporal)   Ht _0  (1.778 m)   Wt 198 lb 4 oz (89.9 kg)   SpO2 98%   BMI 28.45 kg/m  Wt Readings from Last 3 Encounters:  06/01/22 198 lb 4 oz (89.9 kg)  11/22/21 199 lb 6 oz (90.4 kg)  09/28/21 200 lb 12.8 oz (91.1 kg)     There are no preventive care reminders to display for this patient.  There are no preventive care reminders to display for this patient.  Lab Results  Component Value Date   TSH 2.000 02/02/2021   Lab Results  Component Value Date   WBC 5.5 11/22/2021   HGB 18.1 (H) 11/22/2021   HCT 52.4 (H) 11/22/2021   MCV 88 11/22/2021   PLT 247 11/22/2021   Lab Results  Component Value Date   NA 141 11/22/2021   K 4.9 11/22/2021   CO2 21 11/22/2021   GLUCOSE 147 (H) 11/22/2021   BUN 21 11/22/2021   CREATININE 1.20 11/22/2021   BILITOT 0.7 11/22/2021   ALKPHOS 122 (H) 11/22/2021   AST 64 (H) 11/22/2021   ALT 95 (H) 11/22/2021   PROT 6.9 11/22/2021   ALBUMIN 4.5 11/22/2021   CALCIUM 9.3 11/22/2021   EGFR 67 11/22/2021   Lab Results  Component Value Date   CHOL 173 11/22/2021   Lab Results  Component Value Date   HDL 37 (L) 11/22/2021    Lab Results  Component Value Date   LDLCALC 122 (H) 11/22/2021   Lab Results  Component Value Date   TRIG 75 11/22/2021   Lab Results  Component Value Date   CHOLHDL 4.7 11/22/2021   Lab Results  Component Value Date   HGBA1C 5.6 11/22/2021      Assessment & Plan:   Donnis was seen today for medical management of chronic issues and hypotestosteronemia.  Diagnoses and all orders for this visit:  Prediabetes Labs pending. Continue diet and exercise.  -     CMP14+EGFR; Future -     CBC with Differential/Platelet; Future -     Bayer DCA Hb A1c Waived; Future  Mixed hyperlipidemia Elevated liver enzymes.  Last LDL was 122, however liver enzymes were also acutely elevated at the time, likely due to tylenol intake with back pain flare. He will return for a fasting lipid panel and CMP.  Hypotestosteronemia He will return for testosterone level before 10 am. Discussed that I will send in refill once panel is back to see if dosage needs to be titrated.  -     Testosterone,Free and Total; Future  Aortic atherosclerosis (HCC) On aspirin. Has declined statin in the past.   Chronic low back pain without sciatica, unspecified back pain laterality Established with neurosurgery  and chiropractor.   Need for vaccination for zoster Patient has not signed up for a drug plan yet. Discussed with patient and he will follow up with a pharmacy for vaccination.    Follow-up: Return in about 6 months (around 11/30/2022) for chronic follow up.  The patient indicates understanding of these issues and agrees with the plan.    Gwenlyn Perking, FNP

## 2022-06-02 ENCOUNTER — Other Ambulatory Visit: Payer: Medicare Other

## 2022-06-02 DIAGNOSIS — R7303 Prediabetes: Secondary | ICD-10-CM

## 2022-06-02 DIAGNOSIS — E782 Mixed hyperlipidemia: Secondary | ICD-10-CM

## 2022-06-02 DIAGNOSIS — E349 Endocrine disorder, unspecified: Secondary | ICD-10-CM

## 2022-06-02 LAB — BAYER DCA HB A1C WAIVED: HB A1C (BAYER DCA - WAIVED): 5.9 % — ABNORMAL HIGH (ref 4.8–5.6)

## 2022-06-04 LAB — CBC WITH DIFFERENTIAL/PLATELET
Basophils Absolute: 0.1 10*3/uL (ref 0.0–0.2)
Basos: 1 %
EOS (ABSOLUTE): 0.1 10*3/uL (ref 0.0–0.4)
Eos: 2 %
Hematocrit: 52.3 % — ABNORMAL HIGH (ref 37.5–51.0)
Hemoglobin: 17.7 g/dL (ref 13.0–17.7)
Immature Grans (Abs): 0 10*3/uL (ref 0.0–0.1)
Immature Granulocytes: 0 %
Lymphocytes Absolute: 1.9 10*3/uL (ref 0.7–3.1)
Lymphs: 31 %
MCH: 30.7 pg (ref 26.6–33.0)
MCHC: 33.8 g/dL (ref 31.5–35.7)
MCV: 91 fL (ref 79–97)
Monocytes Absolute: 0.5 10*3/uL (ref 0.1–0.9)
Monocytes: 9 %
Neutrophils Absolute: 3.5 10*3/uL (ref 1.4–7.0)
Neutrophils: 57 %
Platelets: 196 10*3/uL (ref 150–450)
RBC: 5.77 x10E6/uL (ref 4.14–5.80)
RDW: 11.6 % (ref 11.6–15.4)
WBC: 6.1 10*3/uL (ref 3.4–10.8)

## 2022-06-04 LAB — LIPID PANEL
Chol/HDL Ratio: 4.8 ratio (ref 0.0–5.0)
Cholesterol, Total: 179 mg/dL (ref 100–199)
HDL: 37 mg/dL — ABNORMAL LOW (ref >39)
LDL Chol Calc (NIH): 116 mg/dL — ABNORMAL HIGH (ref 0–99)
Triglycerides: 143 mg/dL (ref 0–149)
VLDL Cholesterol Cal: 26 mg/dL (ref 5–40)

## 2022-06-04 LAB — CMP14+EGFR
ALT: 59 IU/L — ABNORMAL HIGH (ref 0–44)
AST: 45 IU/L — ABNORMAL HIGH (ref 0–40)
Albumin/Globulin Ratio: 2.1 (ref 1.2–2.2)
Albumin: 4.6 g/dL (ref 3.9–4.9)
Alkaline Phosphatase: 109 IU/L (ref 44–121)
BUN/Creatinine Ratio: 19 (ref 10–24)
BUN: 21 mg/dL (ref 8–27)
Bilirubin Total: 0.4 mg/dL (ref 0.0–1.2)
CO2: 25 mmol/L (ref 20–29)
Calcium: 9.4 mg/dL (ref 8.6–10.2)
Chloride: 100 mmol/L (ref 96–106)
Creatinine, Ser: 1.09 mg/dL (ref 0.76–1.27)
Globulin, Total: 2.2 g/dL (ref 1.5–4.5)
Glucose: 142 mg/dL — ABNORMAL HIGH (ref 70–99)
Potassium: 4.8 mmol/L (ref 3.5–5.2)
Sodium: 137 mmol/L (ref 134–144)
Total Protein: 6.8 g/dL (ref 6.0–8.5)
eGFR: 75 mL/min/{1.73_m2} (ref >59)

## 2022-06-04 LAB — TESTOSTERONE,FREE AND TOTAL
Testosterone, Free: 22.4 pg/mL — ABNORMAL HIGH (ref 6.6–18.1)
Testosterone: 1017 ng/dL — ABNORMAL HIGH (ref 264–916)

## 2022-06-06 ENCOUNTER — Other Ambulatory Visit: Payer: Self-pay | Admitting: Family Medicine

## 2022-06-06 DIAGNOSIS — E349 Endocrine disorder, unspecified: Secondary | ICD-10-CM

## 2022-06-06 MED ORDER — GENOTROPIN MINIQUICK 0.2 MG ~~LOC~~ PRSY: 0.2000 mg | PREFILLED_SYRINGE | SUBCUTANEOUS | 1 refills | Status: DC

## 2022-06-23 ENCOUNTER — Encounter: Payer: Self-pay | Admitting: Nurse Practitioner

## 2022-06-23 ENCOUNTER — Ambulatory Visit (INDEPENDENT_AMBULATORY_CARE_PROVIDER_SITE_OTHER): Payer: Self-pay | Admitting: Nurse Practitioner

## 2022-06-23 DIAGNOSIS — Z024 Encounter for examination for driving license: Secondary | ICD-10-CM

## 2022-06-23 LAB — URINALYSIS
Bilirubin, UA: NEGATIVE
Glucose, UA: NEGATIVE
Leukocytes,UA: NEGATIVE
Nitrite, UA: NEGATIVE
Protein,UA: NEGATIVE
RBC, UA: NEGATIVE
Specific Gravity, UA: 1.02 (ref 1.005–1.030)
Urobilinogen, Ur: 0.2 mg/dL (ref 0.2–1.0)
pH, UA: 7 (ref 5.0–7.5)

## 2022-06-23 NOTE — Progress Notes (Signed)
Private DOT- see scanned in document 

## 2022-07-06 ENCOUNTER — Encounter: Payer: Self-pay | Admitting: Nurse Practitioner

## 2022-07-06 ENCOUNTER — Telehealth (INDEPENDENT_AMBULATORY_CARE_PROVIDER_SITE_OTHER): Payer: Medicare Other | Admitting: Nurse Practitioner

## 2022-07-06 DIAGNOSIS — J029 Acute pharyngitis, unspecified: Secondary | ICD-10-CM

## 2022-07-06 DIAGNOSIS — R5081 Fever presenting with conditions classified elsewhere: Secondary | ICD-10-CM | POA: Diagnosis not present

## 2022-07-06 LAB — VERITOR FLU A/B WAIVED
Influenza A: NEGATIVE
Influenza B: NEGATIVE

## 2022-07-06 NOTE — Progress Notes (Signed)
Virtual Visit Consent   Spencer Stewart, you are scheduled for a virtual visit with Mary-Margaret Hassell Done, [place], a [facility] provider, today.     Just as with appointments in the office, your consent must be obtained to participate.  Your consent will be active for this visit and any virtual visit you may have with one of our providers in the next 365 days.     If you have a MyChart account, a copy of this consent can be sent to you electronically.  All virtual visits are billed to your insurance company just like a traditional visit in the office.    As this is a virtual visit, video technology does not allow for your provider to perform a traditional examination.  This may limit your provider's ability to fully assess your condition.  If your provider identifies any concerns that need to be evaluated in person or the need to arrange testing (such as labs, EKG, etc.), we will make arrangements to do so.     Although advances in technology are sophisticated, we cannot ensure that it will always work on either your end or our end.  If the connection with a video visit is poor, the visit may have to be switched to a telephone visit.  With either a video or telephone visit, we are not always able to ensure that we have a secure connection.     I need to obtain your verbal consent now.   Are you willing to proceed with your visit today? YES   Spencer Stewart has provided verbal consent on 07/06/2022 for a virtual visit (video or telephone).   Mary-Margaret Hassell Done, FNP   Date: 07/06/2022 3:09 PM   Virtual Visit via Video Note   I, Mary-Margaret Hassell Done, connected with Spencer Stewart ([id number], Dec 01, 1955) on 07/06/22 at  3:15 PM EST by a video-enabled telemedicine application and verified that I am speaking with the correct person using two identifiers.  Location: Patient: Virtual Visit Location Patient: Home Provider: Virtual Visit Location Provider: Mobile   I discussed the limitations of  evaluation and management by telemedicine and the availability of in person appointments. The patient expressed understanding and agreed to proceed.    History of Present Illness: Spencer Stewart is a 67 y.o. who identifies as a male who was assigned male at birth, and is being seen today for sore throat.  HPI: Sore Throat  The maximum temperature recorded prior to his arrival was 100.4 - 100.9 F. The fever has been present for Less than 1 day. The pain is at a severity of 7/10. Associated symptoms include congestion, coughing and headaches. Associated symptoms comments: myalgia. He has had no exposure to strep. He has tried acetaminophen for the symptoms. The treatment provided mild relief.    Review of Systems  HENT:  Positive for congestion.   Respiratory:  Positive for cough.   Neurological:  Positive for headaches.    Problems:  Patient Active Problem List   Diagnosis Date Noted   Aortic atherosclerosis ([place]) 09/18/2020   Low back pain 01/18/2017   Hamstring sprain, right, initial encounter 11/25/2016   Hamstring tear 11/22/2016   Hypotestosteronemia 11/22/2016   Prediabetes 06/01/2016   Sports hernia-right inguinal 02/03/2014   Hyperlipidemia 12/02/2013   Unspecified sinusitis (chronic) 09/19/2012    Allergies:  Allergies  Allergen Reactions   Codeine    Crestor [Rosuvastatin] Other (See Comments)    Causes myalgia   Medications:  Current Outpatient Medications:  aspirin 81 MG chewable tablet, Chew by mouth daily., Disp: , Rfl:    cetirizine (ZYRTEC) 10 MG tablet, Take 1 tablet (10 mg total) by mouth daily., Disp: 30 tablet, Rfl: 11   fluticasone (FLONASE) 50 MCG/ACT nasal spray, Place 2 sprays into both nostrils daily., Disp: 16 g, Rfl: 6   Magnesium 400 MG TABS, Take by mouth., Disp: , Rfl:    meloxicam (MOBIC) 15 MG tablet, Take 1 tablet (15 mg total) by mouth daily. (Patient not taking: Reported on 06/01/2022), Disp: 30 tablet, Rfl: 2   Somatropin (GENOTROPIN  MINIQUICK) 0.2 MG PRSY, Inject 0.2 mg into the skin See admin instructions., Disp: 56 each, Rfl: 1   testosterone cypionate (DEPOTESTOSTERONE CYPIONATE) 200 MG/ML injection, INJECT 1ML EVERY 14 DAYS, Disp: 2 mL, Rfl: 0   UNABLE TO FIND, Med Name: flax seed , vit d, zinc and magnesium and BCAA branch-chain amino acids, Disp: , Rfl:   Observations/Objective: Patient is well-developed, well-nourished in no acute distress.  Resting comfortably  at home.  Head is normocephalic, atraumatic.  No labored breathing.  Speech is clear and coherent with logical content.  Patient is alert and oriented at baseline.  Face flushed Dry cough  Assessment and Plan:  Spencer Stewart in today with chief complaint of Sore Throat   1. Fever in other diseases - Veritor Flu A/B Waived  2. Pharyngitis, unspecified etiology 1. Take meds as prescribed 2. Use a cool mist humidifier especially during the winter months and when heat has been humid. 3. Use saline nose sprays frequently 4. Saline irrigations of the nose can be very helpful if done frequently.  * 4X daily for 1 week*  * Use of a nettie pot can be helpful with this. Follow directions with this* 5. Drink plenty of fluids 6. Keep thermostat turn down low 7.For any cough or congestion- mucinex 8. For fever or aces or pains- take tylenol or ibuprofen appropriate for age and weight.  * for fevers greater than 101 orally you may alternate ibuprofen and tylenol every  3 hours.       Follow Up Instructions: I discussed the assessment and treatment plan with the patient. The patient was provided an opportunity to ask questions and all were answered. The patient agreed with the plan and demonstrated an understanding of the instructions.  A copy of instructions were sent to the patient via MyChart.  The patient was advised to call back or seek an in-person evaluation if the symptoms worsen or if the condition fails to improve as anticipated.  Time:  I  spent 15 minutes with the patient via telehealth technology discussing the above problems/concerns.    Mary-Margaret Hassell Done, FNP

## 2022-07-06 NOTE — Patient Instructions (Signed)

## 2022-09-12 ENCOUNTER — Telehealth: Payer: Self-pay | Admitting: Family Medicine

## 2022-09-12 NOTE — Telephone Encounter (Signed)
Called patient to schedule Medicare Annual Wellness Visit (AWV). Left message for patient to call back and schedule Medicare Annual Wellness Visit (AWV).  Last date of AWV: 09/28/2021   Please schedule an appointment at any time with either Mickel Baas or [place], [facility]. .  If any questions, please contact me at [phone].  Thank you,  Colletta [place],  [place] Program Direct Dial ??[phone]

## 2022-10-11 ENCOUNTER — Telehealth: Payer: Self-pay | Admitting: Family Medicine

## 2022-10-11 NOTE — Telephone Encounter (Signed)
Contacted COMMIE FENDLEY to schedule their annual wellness visit. Appointment made for 10/17/2022.  Thank you,  Judeth Cornfield,  [facility] Clinical Support [facility] AWV Program Direct Dial ??[phone]

## 2022-10-19 ENCOUNTER — Ambulatory Visit (INDEPENDENT_AMBULATORY_CARE_PROVIDER_SITE_OTHER): Payer: Medicare Other

## 2022-10-19 VITALS — Ht 71.0 in | Wt 195.0 lb

## 2022-10-19 DIAGNOSIS — Z0000 Encounter for general adult medical examination without abnormal findings: Secondary | ICD-10-CM

## 2022-10-19 NOTE — Progress Notes (Signed)
Subjective:   Spencer Stewart is a 67 y.o. male who presents for Medicare Annual/Subsequent preventive examination. I connected with  Spencer Stewart on 10/19/22 by a audio enabled telemedicine application and verified that I am speaking with the correct person using two identifiers.  Patient Location: Home  Provider Location: Home Office  I discussed the limitations of evaluation and management by telemedicine. The patient expressed understanding and agreed to proceed.  Review of Systems     Cardiac Risk Factors include: advanced age (>17men, >83 women);male gender;dyslipidemia     Objective:    Today's Vitals   10/19/22 1207  Weight: 195 lb (88.5 kg)  Height: 5\' 11"  (1.803 m)   Body mass index is 27.2 kg/m.     10/19/2022   12:10 PM 09/28/2021   11:27 AM 05/19/2016    9:52 AM 02/03/2015    5:09 PM  Advanced Directives  Does Patient Have a Medical Advance Directive? Yes No Yes No  Type of Estate agent of [place];Living will  Healthcare Power of [place];Living will   Copy of Healthcare Power of Attorney in Chart? No - copy requested  No - copy requested   Would patient like information on creating a medical advance directive?  No - Patient declined      Current Medications (verified) Outpatient Encounter Medications as of 10/19/2022  Medication Sig   aspirin 81 MG chewable tablet Chew by mouth daily.   cetirizine (ZYRTEC) 10 MG tablet Take 1 tablet (10 mg total) by mouth daily.   Magnesium 400 MG TABS Take by mouth.   meloxicam (MOBIC) 15 MG tablet Take 1 tablet (15 mg total) by mouth daily.   Somatropin (GENOTROPIN MINIQUICK) 0.2 MG PRSY Inject 0.2 mg into the skin See admin instructions.   testosterone cypionate (DEPOTESTOSTERONE CYPIONATE) 200 MG/ML injection INJECT EVERY 14 DAYS   UNABLE TO FIND Med Name: flax seed , vit d, zinc and magnesium and BCAA branch-chain amino acids   fluticasone (FLONASE) 50 MCG/ACT nasal spray Place 2 sprays into  both nostrils daily. (Patient not taking: Reported on 10/19/2022)   No facility-administered encounter medications on file as of 10/19/2022.    Allergies (verified) Codeine and Crestor [rosuvastatin]   History: Past Medical History:  Diagnosis Date   Hyperlipidemia    Past Surgical History:  Procedure Laterality Date   KNEE ARTHROSCOPY Left 2000   TONSILLECTOMY AND ADENOIDECTOMY     Family History  Problem Relation Age of Onset   Stroke Father    Colon cancer Cousin    Social History   Socioeconomic History   Marital status: Married    Spouse name: Not on file   Number of children: Not on file   Years of education: Not on file   Highest education level: Not on file  Occupational History   Not on file  Tobacco Use   Smoking status: Former   Smokeless tobacco: Former  Building services engineer Use: Never used  Substance and Sexual Activity   Alcohol use: Yes    Comment: twice weekly   Drug use: No   Sexual activity: Not on file  Other Topics Concern   Not on file  Social History Narrative   Not on file   Social Determinants of Health   Financial Resource Strain: Low Risk  (10/19/2022)   Overall Financial Resource Strain (CARDIA)    Difficulty of Paying Living Expenses: Not hard at all  Food Insecurity: No Food Insecurity (10/19/2022)  Hunger Vital Sign    Worried About Running Out of Food in the Last Year: Never true    Ran Out of Food in the Last Year: Never true  Transportation Needs: No Transportation Needs (10/19/2022)   PRAPARE - Administrator, Civil Service (Medical): No    Lack of Transportation (Non-Medical): No  Physical Activity: Sufficiently Active (10/19/2022)   Exercise Vital Sign    Days of Exercise per Week: 5 days    Minutes of Exercise per Session: 60 min  Stress: No Stress Concern Present (10/19/2022)   Spencer Stewart of Occupational Health - Occupational Stress Questionnaire    Feeling of Stress : Not at all  Social Connections:  Socially Integrated (10/19/2022)   Social Connection and Isolation Panel [NHANES]    Frequency of Communication with Friends and Family: More than three times a week    Frequency of Social Gatherings with Friends and Family: More than three times a week    Attends Religious Services: More than 4 times per year    Active Member of [employer] or Organizations: Yes    Attends Engineer, structural: More than 4 times per year    Marital Status: Married    Tobacco Counseling Counseling given: Not Answered   Clinical Intake:  Pre-visit preparation completed: Yes  Pain : No/denies pain     Nutritional Risks: None Diabetes: No  How often do you need to have someone help you when you read instructions, pamphlets, or other written materials from your doctor or pharmacy?: 1 - Never  Diabetic?no   Interpreter Needed?: No  Information entered by :: Renie Ora, LPN   Activities of Daily Living    10/19/2022   12:11 PM  In your present state of health, do you have any difficulty performing the following activities:  Hearing? 0  Vision? 0  Difficulty concentrating or making decisions? 0  Walking or climbing stairs? 0  Dressing or bathing? 0  Doing errands, shopping? 0  Preparing Food and eating ? N  Using the Toilet? N  In the past six months, have you accidently leaked urine? N  Do you have problems with loss of bowel control? N  Managing your Medications? N  Managing your Finances? N  Housekeeping or managing your Housekeeping? N    Patient Care Team: Gabriel Earing, FNP as PCP - General (Family Medicine)  Indicate any recent Medical Services you may have received from other than [facility] providers in the past year (date may be approximate).     Assessment:   This is a routine wellness examination for Spencer Stewart.  Hearing/Vision screen Vision Screening - Comments:: Wears rx glasses - up to date with routine eye exams with  Spencer Stewart   Dietary issues and exercise  activities discussed: Current Exercise Habits: Home exercise routine, Type of exercise: walking;strength training/weights, Time (Minutes): 60, Frequency (Times/Week): 5, Weekly Exercise (Minutes/Week): 300, Intensity: Mild, Exercise limited by: None identified   Goals Addressed             This Visit's Progress    physical activity   On track    Increase physical activity by walking 3x a week       Depression Screen    10/19/2022   12:09 PM 06/01/2022   10:24 AM 11/22/2021   10:01 AM 09/28/2021   10:52 AM 05/12/2021   11:23 AM 02/02/2021    8:34 AM 09/18/2020    8:43 AM  PHQ 2/9 Scores  PHQ -  2 Score 0 0 0 0 0 0 0  PHQ- 9 Score  1 0 0 1 1     Fall Risk    10/19/2022   12:07 PM 06/01/2022   10:23 AM 11/22/2021   10:01 AM 09/28/2021   10:51 AM 08/26/2021   10:28 AM  Fall Risk   Falls in the past year? 0 0 0 0 0  Number falls in past yr: 0   0   Injury with Fall? 0   0   Risk for fall due to : No Fall Risks   No Fall Risks   Follow up Falls prevention discussed   Falls evaluation completed     FALL RISK PREVENTION PERTAINING TO THE HOME:  Any stairs in or around the home? Yes  If so, are there any without handrails? No  Home free of loose throw rugs in walkways, pet beds, electrical cords, etc? Yes  Adequate lighting in your home to reduce risk of falls? Yes   ASSISTIVE DEVICES UTILIZED TO PREVENT FALLS:  Life alert? No  Use of a cane, walker or w/c? No  Grab bars in the bathroom? No  Shower chair or bench in shower? No  Elevated toilet seat or a handicapped toilet? No       09/28/2021   10:57 AM  MMSE - Mini Mental State Exam  Orientation to time 5  Orientation to Place 5  Registration 3  Attention/ Calculation 5  Recall 3  Language- name 2 objects 2  Language- repeat 1  Language- follow 3 step command 3  Language- read & follow direction 1  Write a sentence 1  Copy design 1  Total score 30        10/19/2022   12:11 PM 09/28/2021   11:21 AM  6CIT  Screen  What Year? 0 points 0 points  What month? 0 points 0 points  What time? 0 points 0 points  Count back from 20 0 points 0 points  Months in reverse 0 points 0 points  Repeat phrase 0 points 0 points  Total Score 0 points 0 points    Immunizations Immunization History  Administered Date(s) Administered   Tdap 02/23/2011    TDAP status: Up to date  Flu Vaccine status: Due, Education has been provided regarding the importance of this vaccine. Advised may receive this vaccine at local pharmacy or Health Dept. Aware to provide a copy of the vaccination record if obtained from local pharmacy or Health Dept. Verbalized acceptance and understanding.  Pneumococcal vaccine status: Due, Education has been provided regarding the importance of this vaccine. Advised may receive this vaccine at local pharmacy or Health Dept. Aware to provide a copy of the vaccination record if obtained from local pharmacy or Health Dept. Verbalized acceptance and understanding.  Covid-19 vaccine status: Declined, Education has been provided regarding the importance of this vaccine but patient still declined. Advised may receive this vaccine at local pharmacy or Health Dept.or vaccine clinic. Aware to provide a copy of the vaccination record if obtained from local pharmacy or Health Dept. Verbalized acceptance and understanding.  Qualifies for Shingles Vaccine? Yes   Zostavax completed No   Shingrix Completed?: No.    Education has been provided regarding the importance of this vaccine. Patient has been advised to call insurance company to determine out of pocket expense if they have not yet received this vaccine. Advised may also receive vaccine at local pharmacy or Health Dept. Verbalized acceptance and understanding.  Screening Tests Health Maintenance  Topic Date Due   Zoster Vaccines- Shingrix (1 of 2) Never done   DTaP/Tdap/Td (2 - Td or Tdap) 02/22/2021   Pneumonia Vaccine 41+ Years old (1 of 1 - PCV)  06/02/2023 (Originally 11/17/2020)   COVID-19 Vaccine (1) 06/18/2023 (Originally 05/20/1956)   INFLUENZA VACCINE  02/02/2023   Medicare Annual Wellness (AWV)  10/19/2023   COLONOSCOPY (Pts 45-32yrs Insurance coverage will need to be confirmed)  06/02/2026   Hepatitis C Screening  Completed   HPV VACCINES  Aged Out    Health Maintenance  Health Maintenance Due  Topic Date Due   Zoster Vaccines- Shingrix (1 of 2) Never done   DTaP/Tdap/Td (2 - Td or Tdap) 02/22/2021    Colorectal cancer screening: Type of screening: Colonoscopy. Completed 06/02/2016. Repeat every 10 years  Lung Cancer Screening: (Low Dose CT Chest recommended if Age 55-80 years, 30 pack-year currently smoking OR have quit w/in 15years.) does not qualify.   Lung Cancer Screening Referral: n/a  Additional Screening:  Hepatitis C Screening: does not qualify; Completed 02/02/2021  Vision Screening: Recommended annual ophthalmology exams for early detection of glaucoma and other disorders of the eye. Is the patient up to date with their annual eye exam?  Yes  Who is the provider or what is the name of the office in which the patient attends annual eye exams? Spencer Stewart  If pt is not established with a provider, would they like to be referred to a provider to establish care? No .   Dental Screening: Recommended annual dental exams for proper oral hygiene  Community Resource Referral / Chronic Care Management: CRR required this visit?  No   CCM required this visit?  No      Plan:     I have personally reviewed and noted the following in the patient's chart:   Medical and social history Use of alcohol, tobacco or illicit drugs  Current medications and supplements including opioid prescriptions. Patient is not currently taking opioid prescriptions. Functional ability and status Nutritional status Physical activity Advanced directives List of other physicians Hospitalizations, surgeries, and ER visits in  previous 12 months Vitals Screenings to include cognitive, depression, and falls Referrals and appointments  In addition, I have reviewed and discussed with patient certain preventive protocols, quality metrics, and best practice recommendations. A written personalized care plan for preventive services as well as general preventive health recommendations were provided to patient.     Lorrene Reid, LPN   1/61/0960   Nurse Notes: none

## 2022-10-19 NOTE — Patient Instructions (Signed)
[name] , Thank you for taking time to come for your Medicare Wellness Visit. I appreciate your ongoing commitment to your health goals. Please review the following plan we discussed and let me know if I can assist you in the future.   These are the goals we discussed:  Goals      physical activity     Increase physical activity by walking 3x a week        This is a list of the screening recommended for you and due dates:  Health Maintenance  Topic Date Due   Zoster (Shingles) Vaccine (1 of 2) Never done   DTaP/Tdap/Td vaccine (2 - Td or Tdap) 02/22/2021   Pneumonia Vaccine (1 of 1 - PCV) 06/02/2023*   COVID-19 Vaccine (1) 06/18/2023*   Flu Shot  02/02/2023   Medicare Annual Wellness Visit  10/19/2023   Colon Cancer Screening  06/02/2026   Hepatitis C Screening: USPSTF Recommendation to screen - Ages 18-79 yo.  Completed   HPV Vaccine  Aged Out  *Topic was postponed. The date shown is not the original due date.    Advanced directives: Please bring a copy of your health care power of attorney and living will to the office to be added to your chart at your convenience.   Conditions/risks identified: Aim for 30 minutes of exercise or brisk walking, 6-8 glasses of water, and 5 servings of fruits and vegetables each day.   Next appointment: Follow up in one year for your annual wellness visit.   Preventive Care 67 Years and Older, Male  Preventive care refers to lifestyle choices and visits with your health care provider that can promote health and wellness. What does preventive care include? A yearly physical exam. This is also called an annual well check. Dental exams once or twice a year. Routine eye exams. Ask your health care provider how often you should have your eyes checked. Personal lifestyle choices, including: Daily care of your teeth and gums. Regular physical activity. Eating a healthy diet. Avoiding tobacco and drug use. Limiting alcohol use. Practicing safe  sex. Taking low doses of aspirin every day. Taking vitamin and mineral supplements as recommended by your health care provider. What happens during an annual well check? The services and screenings done by your health care provider during your annual well check will depend on your age, overall health, lifestyle risk factors, and family history of disease. Counseling  Your health care provider may ask you questions about your: Alcohol use. Tobacco use. Drug use. Emotional well-being. Home and relationship well-being. Sexual activity. Eating habits. History of falls. Memory and ability to understand (cognition). Work and work Astronomer. Screening  You may have the following tests or measurements: Height, weight, and BMI. Blood pressure. Lipid and cholesterol levels. These may be checked every 5 years, or more frequently if you are over 108 years old. Skin check. Lung cancer screening. You may have this screening every year starting at age 54 if you have a 30-pack-year history of smoking and currently smoke or have quit within the past 15 years. Fecal occult blood test (FOBT) of the stool. You may have this test every year starting at age 74. Flexible sigmoidoscopy or colonoscopy. You may have a sigmoidoscopy every 5 years or a colonoscopy every 10 years starting at age 66. Prostate cancer screening. Recommendations will vary depending on your family history and other risks. Hepatitis C blood test. Hepatitis B blood test. Sexually transmitted disease (STD) testing. Diabetes screening.  This is done by checking your blood sugar (glucose) after you have not eaten for a while (fasting). You may have this done every 1-3 years. Abdominal aortic aneurysm (AAA) screening. You may need this if you are a current or former smoker. Osteoporosis. You may be screened starting at age 74 if you are at high risk. Talk with your health care provider about your test results, treatment options, and if  necessary, the need for more tests. Vaccines  Your health care provider may recommend certain vaccines, such as: Influenza vaccine. This is recommended every year. Tetanus, diphtheria, and acellular pertussis (Tdap, Td) vaccine. You may need a Td booster every 10 years. Zoster vaccine. You may need this after age 3. Pneumococcal 13-valent conjugate (PCV13) vaccine. One dose is recommended after age 25. Pneumococcal polysaccharide (PPSV23) vaccine. One dose is recommended after age 4. Talk to your health care provider about which screenings and vaccines you need and how often you need them. This information is not intended to replace advice given to you by your health care provider. Make sure you discuss any questions you have with your health care provider. Document Released: 07/17/2015 Document Revised: 03/09/2016 Document Reviewed: 04/21/2015 Elsevier Interactive Patient Education  2017 [place] Prevention in the Home Falls can cause injuries. They can happen to people of all ages. There are many things you can do to make your home safe and to help prevent falls. What can I do on the outside of my home? Regularly fix the edges of walkways and driveways and fix any cracks. Remove anything that might make you trip as you walk through a door, such as a raised step or threshold. Trim any bushes or trees on the path to your home. Use bright outdoor lighting. Clear any walking paths of anything that might make someone trip, such as rocks or tools. Regularly check to see if handrails are loose or broken. Make sure that both sides of any steps have handrails. Any raised decks and porches should have guardrails on the edges. Have any leaves, snow, or ice cleared regularly. Use sand or salt on walking paths during winter. Clean up any spills in your garage right away. This includes oil or grease spills. What can I do in the bathroom? Use night lights. Install grab bars by the toilet  and in the tub and shower. Do not use towel bars as grab bars. Use non-skid mats or decals in the tub or shower. If you need to sit down in the shower, use a plastic, non-slip stool. Keep the floor dry. Clean up any water that spills on the floor as soon as it happens. Remove soap buildup in the tub or shower regularly. Attach bath mats securely with double-sided non-slip rug tape. Do not have throw rugs and other things on the floor that can make you trip. What can I do in the bedroom? Use night lights. Make sure that you have a light by your bed that is easy to reach. Do not use any sheets or blankets that are too big for your bed. They should not hang down onto the floor. Have a firm chair that has side arms. You can use this for support while you get dressed. Do not have throw rugs and other things on the floor that can make you trip. What can I do in the kitchen? Clean up any spills right away. Avoid walking on wet floors. Keep items that you use a lot in easy-to-reach places. If  you need to reach something above you, use a strong step stool that has a grab bar. Keep electrical cords out of the way. Do not use floor polish or wax that makes floors slippery. If you must use wax, use non-skid floor wax. Do not have throw rugs and other things on the floor that can make you trip. What can I do with my stairs? Do not leave any items on the stairs. Make sure that there are handrails on both sides of the stairs and use them. Fix handrails that are broken or loose. Make sure that handrails are as long as the stairways. Check any carpeting to make sure that it is firmly attached to the stairs. Fix any carpet that is loose or worn. Avoid having throw rugs at the top or bottom of the stairs. If you do have throw rugs, attach them to the floor with carpet tape. Make sure that you have a light switch at the top of the stairs and the bottom of the stairs. If you do not have them, ask someone to add  them for you. What else can I do to help prevent falls? Wear shoes that: Do not have high heels. Have rubber bottoms. Are comfortable and fit you well. Are closed at the toe. Do not wear sandals. If you use a stepladder: Make sure that it is fully opened. Do not climb a closed stepladder. Make sure that both sides of the stepladder are locked into place. Ask someone to hold it for you, if possible. Clearly mark and make sure that you can see: Any grab bars or handrails. First and last steps. Where the edge of each step is. Use tools that help you move around (mobility aids) if they are needed. These include: Canes. Walkers. Scooters. Crutches. Turn on the lights when you go into a dark area. Replace any light bulbs as soon as they burn out. Set up your furniture so you have a clear path. Avoid moving your furniture around. If any of your floors are uneven, fix them. If there are any pets around you, be aware of where they are. Review your medicines with your doctor. Some medicines can make you feel dizzy. This can increase your chance of falling. Ask your doctor what other things that you can do to help prevent falls. This information is not intended to replace advice given to you by your health care provider. Make sure you discuss any questions you have with your health care provider. Document Released: 04/16/2009 Document Revised: 11/26/2015 Document Reviewed: 07/25/2014 Elsevier Interactive Patient Education  2017 [employer].

## 2022-12-01 ENCOUNTER — Ambulatory Visit: Payer: Medicare Other | Admitting: Family Medicine

## 2022-12-01 ENCOUNTER — Other Ambulatory Visit: Payer: Self-pay | Admitting: Family Medicine

## 2022-12-01 DIAGNOSIS — E349 Endocrine disorder, unspecified: Secondary | ICD-10-CM

## 2022-12-12 ENCOUNTER — Ambulatory Visit: Payer: Medicare Other | Admitting: Family Medicine

## 2023-01-13 ENCOUNTER — Encounter: Payer: Self-pay | Admitting: Family Medicine

## 2023-01-13 ENCOUNTER — Ambulatory Visit (INDEPENDENT_AMBULATORY_CARE_PROVIDER_SITE_OTHER): Payer: Medicare Other | Admitting: Family Medicine

## 2023-01-13 VITALS — BP 130/84 | HR 66 | Temp 97.7°F | Resp 20 | Ht 71.0 in | Wt 194.4 lb

## 2023-01-13 DIAGNOSIS — R7303 Prediabetes: Secondary | ICD-10-CM | POA: Diagnosis not present

## 2023-01-13 DIAGNOSIS — I700 Atherosclerosis of aorta: Secondary | ICD-10-CM

## 2023-01-13 DIAGNOSIS — E782 Mixed hyperlipidemia: Secondary | ICD-10-CM

## 2023-01-13 DIAGNOSIS — R748 Abnormal levels of other serum enzymes: Secondary | ICD-10-CM | POA: Diagnosis not present

## 2023-01-13 DIAGNOSIS — E349 Endocrine disorder, unspecified: Secondary | ICD-10-CM | POA: Diagnosis not present

## 2023-01-13 LAB — CBC WITH DIFFERENTIAL/PLATELET
Basophils Absolute: 0.1 10*3/uL (ref 0.0–0.2)
Hemoglobin: 17.2 g/dL (ref 13.0–17.7)
MCH: 30.4 pg (ref 26.6–33.0)
Monocytes Absolute: 0.7 10*3/uL (ref 0.1–0.9)
Neutrophils Absolute: 3.6 10*3/uL (ref 1.4–7.0)
RDW: 12.1 % (ref 11.6–15.4)

## 2023-01-13 LAB — CMP14+EGFR
AST: 28 IU/L (ref 0–40)
CO2: 25 mmol/L (ref 20–29)
Potassium: 4.5 mmol/L (ref 3.5–5.2)

## 2023-01-13 LAB — BAYER DCA HB A1C WAIVED: HB A1C (BAYER DCA - WAIVED): 5.9 % — ABNORMAL HIGH (ref 4.8–5.6)

## 2023-01-13 MED ORDER — TESTOSTERONE CYPIONATE 200 MG/ML IM SOLN
INTRAMUSCULAR | 0 refills | Status: DC
Start: 2023-01-13 — End: 2023-07-17

## 2023-01-13 MED ORDER — GENOTROPIN MINIQUICK 0.2 MG ~~LOC~~ PRSY
PREFILLED_SYRINGE | SUBCUTANEOUS | 0 refills | Status: DC
Start: 2023-01-13 — End: 2023-07-17

## 2023-01-13 NOTE — Progress Notes (Signed)
Established Patient Office Visit  Subjective:  Patient ID: Spencer Stewart, male    DOB: 30-Sep-1955  Age: 67 y.o. MRN: [id number]  CC:  Chief Complaint  Patient presents with   Medical Management of Chronic Issues    HPI Spencer Stewart presents for chronic follow up. He reports doing well and has no concerns today.   Prediabetes/Cholesterol His a1c was 5.9 at last visit. He continues to eat a healthy diet and exercises regularly. He did not start red yeast rice supplement. He continues to be active and eat a healthy diet.   2. Low testosterone Reports can feel fatigue when injection is coming up do.   3. Chronic low back pain Seeing chiropractor. Stable. No red flags. He 3x a week takes ibuprofen if he going to be more active. .   4. Elevated liver enzymes Denies RUQ pain, nausea, vomiting, jaundice.   Past Medical History:  Diagnosis Date   Hyperlipidemia     Past Surgical History:  Procedure Laterality Date   KNEE ARTHROSCOPY Left 2000   TONSILLECTOMY AND ADENOIDECTOMY      Family History  Problem Relation Age of Onset   Stroke Father    Colon cancer Cousin     Social History   Socioeconomic History   Marital status: Married    Spouse name: Not on file   Number of children: Not on file   Years of education: Not on file   Highest education level: Not on file  Occupational History   Not on file  Tobacco Use   Smoking status: Former   Smokeless tobacco: Former  Building services engineer status: Never Used  Substance and Sexual Activity   Alcohol use: Yes    Comment: twice weekly   Drug use: No   Sexual activity: Not on file  Other Topics Concern   Not on file  Social History Narrative   Not on file   Social Determinants of Health   Financial Resource Strain: Low Risk  (10/19/2022)   Overall Financial Resource Strain (CARDIA)    Difficulty of Paying Living Expenses: Not hard at all  Food Insecurity: No Food Insecurity (10/19/2022)   Hunger Vital Sign     Worried About Running Out of Food in the Last Year: Never true    Ran Out of Food in the Last Year: Never true  Transportation Needs: No Transportation Needs (10/19/2022)   PRAPARE - Administrator, Civil Service (Medical): No    Lack of Transportation (Non-Medical): No  Physical Activity: Sufficiently Active (10/19/2022)   Exercise Vital Sign    Days of Exercise per Week: 5 days    Minutes of Exercise per Session: 60 min  Stress: No Stress Concern Present (10/19/2022)   [employer] of Occupational Health - Occupational Stress Questionnaire    Feeling of Stress : Not at all  Social Connections: Socially Integrated (10/19/2022)   Social Connection and Isolation Panel [NHANES]    Frequency of Communication with Friends and Family: More than three times a week    Frequency of Social Gatherings with Friends and Family: More than three times a week    Attends Religious Services: More than 4 times per year    Active Member of [employer] or Organizations: Yes    Attends Banker Meetings: More than 4 times per year    Marital Status: Married  Catering manager Violence: Not At Risk (10/19/2022)   Humiliation, Afraid, Rape, and Kick questionnaire  Fear of Current or Ex-Partner: No    Emotionally Abused: No    Physically Abused: No    Sexually Abused: No    Outpatient Medications Prior to Visit  Medication Sig Dispense Refill   aspirin 81 MG chewable tablet Chew by mouth daily.     cetirizine (ZYRTEC) 10 MG tablet Take 1 tablet (10 mg total) by mouth daily. 30 tablet 11   Magnesium 400 MG TABS Take by mouth.     Somatropin (GENOTROPIN MINIQUICK) 0.2 MG PRSY INJECT 0.2MG  SQ DAILY 56 each 0   testosterone cypionate (DEPOTESTOSTERONE CYPIONATE) 200 MG/ML injection INJECT EVERY 14 DAYS 2 mL 0   UNABLE TO FIND Med Name: flax seed , vit d, zinc and magnesium and BCAA branch-chain amino acids     fluticasone (FLONASE) 50 MCG/ACT nasal spray Place 2 sprays into both  nostrils daily. (Patient not taking: Reported on 10/19/2022) 16 g 6   meloxicam (MOBIC) 15 MG tablet Take 1 tablet (15 mg total) by mouth daily. (Patient not taking: Reported on 01/13/2023) 30 tablet 2   No facility-administered medications prior to visit.    Allergies  Allergen Reactions   Codeine    Crestor [Rosuvastatin] Other (See Comments)    Causes myalgia    ROS Review of Systems Negative unless specially indicated above in HPI.   Objective:    Physical Exam Vitals and nursing note reviewed.  Constitutional:      General: He is not in acute distress.    Appearance: Normal appearance. He is not ill-appearing, toxic-appearing or diaphoretic.  HENT:     Head: Normocephalic and atraumatic.     Right Ear: Tympanic membrane, ear canal and external ear normal.     Left Ear: Tympanic membrane, ear canal and external ear normal.     Nose: Nose normal.     Mouth/Throat:     Mouth: Mucous membranes are moist.     Pharynx: Oropharynx is clear.  Eyes:     General:        Right eye: No discharge.        Left eye: No discharge.     Extraocular Movements: Extraocular movements intact.     Conjunctiva/sclera: Conjunctivae normal.     Pupils: Pupils are equal, round, and reactive to light.  Neck:     Thyroid: No thyroid mass, thyromegaly or thyroid tenderness.     Vascular: No carotid bruit.  Cardiovascular:     Rate and Rhythm: Normal rate and regular rhythm.     Heart sounds: Normal heart sounds. No murmur heard. Pulmonary:     Effort: Pulmonary effort is normal. No respiratory distress.     Breath sounds: Normal breath sounds.  Abdominal:     General: Bowel sounds are normal. There is no distension.     Palpations: Abdomen is soft.     Tenderness: There is no abdominal tenderness. There is no guarding or rebound.  Musculoskeletal:        General: No swelling.     Cervical back: Neck supple.     Right lower leg: No edema.     Left lower leg: No edema.  Skin:    General:  Skin is warm and dry.     Coloration: Skin is not jaundiced.  Neurological:     General: No focal deficit present.     Mental Status: He is alert and oriented to person, place, and time.     Cranial Nerves: No cranial nerve deficit.  Motor: No weakness.     Coordination: Coordination normal.     Gait: Gait normal.  Psychiatric:        Mood and Affect: Mood normal.        Behavior: Behavior normal.        Thought Content: Thought content normal.        Judgment: Judgment normal.     BP 130/84   Pulse 66   Temp 97.7 F (36.5 C) (Oral)   Resp 20   Ht 5\' 11"  (1.803 m)   Wt 194 lb 6 oz (88.2 kg)   SpO2 95%   BMI 27.11 kg/m  Wt Readings from Last 3 Encounters:  01/13/23 194 lb 6 oz (88.2 kg)  10/19/22 195 lb (88.5 kg)  06/01/22 198 lb 4 oz (89.9 kg)      Assessment & Plan:   Audie Shakespear" was seen today for medical management of chronic issues.  Diagnoses and all orders for this visit:  Prediabetes Labs pending. Continue diet and exercise.  -     CBC with Differential/Platelet -     CMP14+EGFR -     Lipid panel -     Bayer DCA Hb A1c Waived  Mixed hyperlipidemia Diet and exercise. Labs pending. Declined statin previously and did not start red yeast rice supplement.  -     CBC with Differential/Platelet -     CMP14+EGFR -     Lipid panel -     Bayer DCA Hb A1c Waived  Hypotestosteronemia Labs pending. Refill on supplement sent.  -     Testosterone,Free and Total  Aortic atherosclerosis (HCC) On statin. Labs pending.  -     Lipid panel  Elevated liver enzymes Labs pending. Avoid tylenol, alcohol. Will discussed RUQ [place] pending labs.  -     CMP14+EGFR  Follow-up: Return in about 6 months (around 07/16/2023) for chronic follow up.  The patient indicates understanding of these issues and agrees with the plan.    Gabriel Earing, FNP

## 2023-01-14 LAB — CMP14+EGFR
Alkaline Phosphatase: 113 IU/L (ref 44–121)
BUN/Creatinine Ratio: 19 (ref 10–24)
BUN: 23 mg/dL (ref 8–27)
Bilirubin Total: 0.4 mg/dL (ref 0.0–1.2)
Calcium: 9.8 mg/dL (ref 8.6–10.2)
Chloride: 102 mmol/L (ref 96–106)
Creatinine, Ser: 1.18 mg/dL (ref 0.76–1.27)
Globulin, Total: 2.1 g/dL (ref 1.5–4.5)
Glucose: 151 mg/dL — ABNORMAL HIGH (ref 70–99)
Total Protein: 6.5 g/dL (ref 6.0–8.5)

## 2023-01-14 LAB — TESTOSTERONE,FREE AND TOTAL: Testosterone: 1016 ng/dL — ABNORMAL HIGH (ref 264–916)

## 2023-01-14 LAB — CBC WITH DIFFERENTIAL/PLATELET
Basos: 1 %
Eos: 2 %
Immature Granulocytes: 1 %
MCHC: 33.9 g/dL (ref 31.5–35.7)
MCV: 90 fL (ref 79–97)
Monocytes: 12 %
WBC: 6.2 10*3/uL (ref 3.4–10.8)

## 2023-01-14 LAB — LIPID PANEL
Chol/HDL Ratio: 4.5 ratio (ref 0.0–5.0)
Cholesterol, Total: 175 mg/dL (ref 100–199)
LDL Chol Calc (NIH): 120 mg/dL — ABNORMAL HIGH (ref 0–99)
Triglycerides: 86 mg/dL (ref 0–149)
VLDL Cholesterol Cal: 16 mg/dL (ref 5–40)

## 2023-01-15 LAB — CMP14+EGFR
ALT: 38 IU/L (ref 0–44)
Albumin: 4.4 g/dL (ref 3.9–4.9)
Sodium: 140 mmol/L (ref 134–144)
eGFR: 68 mL/min/{1.73_m2} (ref >59)

## 2023-01-15 LAB — CBC WITH DIFFERENTIAL/PLATELET
EOS (ABSOLUTE): 0.1 10*3/uL (ref 0.0–0.4)
Hematocrit: 50.7 % (ref 37.5–51.0)
Immature Grans (Abs): 0 10*3/uL (ref 0.0–0.1)
Lymphocytes Absolute: 1.8 10*3/uL (ref 0.7–3.1)
Lymphs: 28 %
Neutrophils: 56 %
Platelets: 201 10*3/uL (ref 150–450)
RBC: 5.66 x10E6/uL (ref 4.14–5.80)

## 2023-01-15 LAB — LIPID PANEL: HDL: 39 mg/dL — ABNORMAL LOW (ref >39)

## 2023-01-18 ENCOUNTER — Other Ambulatory Visit: Payer: Self-pay | Admitting: Family Medicine

## 2023-01-18 ENCOUNTER — Other Ambulatory Visit: Payer: Self-pay

## 2023-01-18 DIAGNOSIS — E349 Endocrine disorder, unspecified: Secondary | ICD-10-CM

## 2023-01-18 DIAGNOSIS — E782 Mixed hyperlipidemia: Secondary | ICD-10-CM

## 2023-01-18 NOTE — Progress Notes (Signed)
 Patient r/c  

## 2023-01-23 ENCOUNTER — Telehealth: Payer: Self-pay | Admitting: Family Medicine

## 2023-01-30 ENCOUNTER — Other Ambulatory Visit: Payer: Medicare Other

## 2023-01-30 DIAGNOSIS — E782 Mixed hyperlipidemia: Secondary | ICD-10-CM

## 2023-01-30 DIAGNOSIS — E349 Endocrine disorder, unspecified: Secondary | ICD-10-CM

## 2023-01-31 NOTE — Telephone Encounter (Signed)
I spoke to patient. He still wants to hold off on statin. He states he will work on diet and using over the counter supplement. I explained patients most recent lab values. I explained that the provider will be commenting on those results soon.  He admits that his son was in town and he admits during that time he had 4-5 helpings of rib eye steak. He admits that this is not his normal diet. He wonders if this could be responsible for the spike in TC and LDL.

## 2023-02-02 NOTE — Telephone Encounter (Signed)
Yes, lipid panel would be affected by red meat in his diet. He can try red yeast rice supplement as previously recommend first, along with a heart healthy diet. Continue exercise.   Testosterone is low mid injection. I would like him to see urology to see if his dose needs to be adjusted. Is he agreeable to this?

## 2023-02-02 NOTE — Telephone Encounter (Signed)
Patient aware and verbalizes understanding.  States that his level was high due to him getting the injection a few days before lab work.  Also states the next reading was low since he was out of town and went 3 weeks without the injection.  Would like to stay on the same dose he is on and would not like to go to urology at this time.

## 2023-02-04 ENCOUNTER — Other Ambulatory Visit: Payer: Self-pay | Admitting: Family Medicine

## 2023-02-04 DIAGNOSIS — E349 Endocrine disorder, unspecified: Secondary | ICD-10-CM

## 2023-05-05 ENCOUNTER — Encounter: Payer: Self-pay | Admitting: Family Medicine

## 2023-05-05 ENCOUNTER — Ambulatory Visit (INDEPENDENT_AMBULATORY_CARE_PROVIDER_SITE_OTHER): Payer: Medicare Other | Admitting: Family Medicine

## 2023-05-05 VITALS — BP 124/78 | HR 73 | Temp 98.3°F | Ht 71.0 in | Wt 195.2 lb

## 2023-05-05 DIAGNOSIS — K4090 Unilateral inguinal hernia, without obstruction or gangrene, not specified as recurrent: Secondary | ICD-10-CM | POA: Diagnosis not present

## 2023-05-05 NOTE — Progress Notes (Signed)
   Acute Office Visit  Subjective:     Patient ID: Spencer Stewart, male    DOB: [birth year], 67 y.o.   MRN: [id number]  Chief Complaint  Patient presents with   Hernia    HPI Patient is in today for knot of his left groin for 6 weeks. It is a soft mobile bulge. Started after a heavy leg lifting day. Felt pressure and tender. Starting feeling some mild to moderate discomfort after playing some tennis a few days later. Pain improved after a 10 day rest of tennis. Once he played tennis again pain increased. No urinary symptoms. Hx of hernia of right inguinal hernia that self resolved after rest for 4 weeks. He is very physically active and lifts weights 5 days a week. He has continued to lift but has decreased the amount of weight and has avoided any lower body activity or any movements that cause strain. No nausea, vomiting, or change in bowel habits.    ROS As per HPI.      Objective:    BP 124/78   Pulse 73   Temp 98.3 F (36.8 C) (Temporal)   Ht 5\' 11"  (1.803 m)   Wt 195 lb 3.2 oz (88.5 kg)   SpO2 95%   BMI 27.22 kg/m    Physical Exam Vitals and nursing note reviewed.  Constitutional:      General: He is not in acute distress.    Appearance: Normal appearance. He is not ill-appearing, toxic-appearing or diaphoretic.  Cardiovascular:     Heart sounds: Normal heart sounds.  Pulmonary:     Effort: Pulmonary effort is normal.  Abdominal:     Hernia: A hernia is present. Hernia is present in the left inguinal area (soft, reducible).  Musculoskeletal:     Right lower leg: No edema.     Left lower leg: No edema.  Skin:    General: Skin is warm and dry.  Neurological:     General: No focal deficit present.     Mental Status: He is alert and oriented to person, place, and time.  Psychiatric:        Mood and Affect: Mood normal.        Behavior: Behavior normal.     No results found for any visits on 05/05/23.      Assessment & Plan:   Spencer Stewart" was seen today  for hernia.  Diagnoses and all orders for this visit:  Left inguinal hernia Soft, reducible. He declined referral to general surgery today and would prefer to rest for 4 weeks and then update me. Discussed avoiding strain, limiting lifting to <20 lbs. Aware of when to seek emergency care.    Return if symptoms worsen or fail to improve.  The patient indicates understanding of these issues and agrees with the plan.  Gabriel Earing, FNP

## 2023-05-05 NOTE — Patient Instructions (Signed)
Hernia, Adult     A hernia happens when an organ or tissue inside your body pushes out through a weak spot in the muscles of your belly. This makes a bulge. The bulge may be: In a scar from surgery. This type of bulge is called an incisional hernia. Near your belly button. This type is called an umbilical hernia. In your groin, which is the area where your leg meets your lower belly. This type is called an inguinal hernia. If you're male, this type could also be in your scrotum. In your upper thigh. This type is called a femoral hernia. Inside your belly. This type is called a hiatal hernia. It happens when your stomach slides above your diaphragm, which is the muscle between your belly and your chest. What are the causes? You may get a hernia if: You lift heavy things. You cough over a long period of time. You have trouble pooping, also called constipation. Trouble pooping can lead to straining. There's a cut from surgery in your belly. You have a problem that's present at birth. There's fluid around your belly. You're male and have a testicle that hasn't moved down into your scrotum. You may be at greater risk for hernia if: You smoke. You're very overweight. What are the signs or symptoms? The main symptom is a bulge, but the bulge may not always be seen. It may grow bigger or be easier to see when you cough or strain, such as when you lift something heavy. If you can push the bulge back into your belly, it may not cause pain. If you can't push it back into your belly, it may lose its blood supply. This can cause: Pain. Fever. Nausea and vomiting. Swelling. Trouble pooping. How is this diagnosed? A hernia may be diagnosed based on your symptoms, medical history, and an exam. Your health care provider may ask you to cough or move in a way that makes the bulge easier to see. You may also have tests done. These may include: X-rays. Ultrasound. CT scan. How is this treated? A  hernia that's small and painless may not need to be treated. A hernia that's large or painful may be treated with surgery. Surgery involves pushing the bulge back into place and fixing the weak area of the muscle or belly. Follow these instructions at home: Activity Try not to strain. You may have to avoid lifting. Ask how much weight you can safely lift. If you lift something heavy, use your leg muscles. Do not use your back muscles to lift. Prevent trouble pooping You may need to take these actions to prevent or treat trouble pooping: Drink enough fluid to keep your pee (urine) pale yellow. Take medicines to help you poop. Eat foods high in fiber. These include beans, whole grains, and fresh fruits and vegetables. General instructions When you cough, try to cough gently. Try to push the bulge back in by very gently pressing on it when you're lying down. Do not try to force it back in if it won't push in easily. If you're overweight, work with your provider to lose weight safely. Do not smoke, vape, or use products with nicotine or tobacco in them. If you need help quitting, talk with your provider. If you're going to have surgery, watch your hernia for changes in shape, size, or color. Tell your provider about any changes. Contact a health care provider if: You get new pain, swelling, or redness near your hernia. You have trouble pooping.  Your poop is harder or larger than normal. You have a fever or chills. You have nausea or vomiting. Your hernia can't be pushed in. Get help right away if: You have belly pain that gets worse. Your hernia: Changes in shape or size. Changes color. Feels hard or hurts when you touch it. These symptoms may be an emergency. Call 911 right away. Do not wait to see if the symptoms will go away. Do not drive yourself to the hospital. This information is not intended to replace advice given to you by your health care provider. Make sure you discuss any  questions you have with your health care provider. Document Revised: 09/13/2022 Document Reviewed: 09/13/2022 Elsevier Patient Education  2024 [employer].

## 2023-05-23 ENCOUNTER — Ambulatory Visit (INDEPENDENT_AMBULATORY_CARE_PROVIDER_SITE_OTHER): Payer: Self-pay | Admitting: Sports Medicine

## 2023-05-23 ENCOUNTER — Telehealth: Payer: Self-pay | Admitting: Family Medicine

## 2023-05-23 ENCOUNTER — Encounter: Payer: Self-pay | Admitting: Sports Medicine

## 2023-05-23 VITALS — BP 130/90 | Ht 71.0 in | Wt 195.0 lb

## 2023-05-23 DIAGNOSIS — K4090 Unilateral inguinal hernia, without obstruction or gangrene, not specified as recurrent: Secondary | ICD-10-CM

## 2023-05-23 NOTE — Telephone Encounter (Signed)
Patient was recommended a general surgeon by a friend of the family.  He would like a referral for hernia repair sent Andee Lineman, Md with [facility]

## 2023-05-23 NOTE — Progress Notes (Signed)
   Subjective:    Patient ID: Spencer Stewart, male    DOB: October 14, 1955, 67 y.o.   MRN: [id number]  HPI chief complaint: Hernia  Patient is a very pleasant and very fit 67 year old male that comes in today complaining of a bulge in the left groin area that has been present now for several weeks.  He initially felt pain when lifting weights.  He then began to notice pain with playing tennis as well.  He saw his PCP who recommended surgical consultation but Spencer Stewart decided to simply back down from his exercise routine and try to allow it to heal on its own.  Unfortunately, that has not happened.  Although he has really backed down from lifting anything heavy, he still has frequent pain in the area.  He also notices a bulge with Valsalva.  He was diagnosed in the past with a right sports hernia that did not require surgery.  His goal is to be able to return to his active lifestyle.  Past medical history reviewed Medications reviewed Allergies reviewed  Review of Systems As above    Objective:   Physical Exam  Well-developed, fit appearing.  No acute distress  Abdomen: There is a small palpable defect in the left lower abdomen.  Small hernia present with Valsalva.  It is reducible.      Assessment & Plan:   Direct abdominal hernia  I explained the difference between abdominal hernias and sports hernias.  Spencer Stewart clearly has a direct abdominal hernia.  I agree with surgical consultation to discuss surgical options as this is definitely affecting his lifestyle.  A couple of general surgeons were recommended to him by another physician and he may get that referral either from me or from his PCP.  I did discuss the signs and symptoms of an incarcerated hernia and he understands that he will need to seek treatment immediately in the ER if any of these signs or symptoms develop.  This note was dictated using Dragon naturally speaking software and may contain errors in syntax, spelling, or content which  have not been identified prior to signing this note.

## 2023-05-23 NOTE — Telephone Encounter (Signed)
Please call patient to discuss Hernia referral that pt declined at last visit on 11/1.   Copied from CRM [id number]. Topic: Referral - Question >> May 23, 2023  1:34 PM Theodis Sato wrote: Reason for CRM: PT would like to discuss a specific referral request  with Harlow Mares

## 2023-05-24 NOTE — Telephone Encounter (Signed)
Referral placed as requested.

## 2023-07-17 ENCOUNTER — Encounter: Payer: Self-pay | Admitting: Family Medicine

## 2023-07-17 ENCOUNTER — Ambulatory Visit (INDEPENDENT_AMBULATORY_CARE_PROVIDER_SITE_OTHER): Payer: Medicare Other | Admitting: Family Medicine

## 2023-07-17 VITALS — BP 134/80 | HR 68 | Temp 98.1°F | Ht 71.0 in | Wt 190.4 lb

## 2023-07-17 DIAGNOSIS — E782 Mixed hyperlipidemia: Secondary | ICD-10-CM | POA: Diagnosis not present

## 2023-07-17 DIAGNOSIS — Z8719 Personal history of other diseases of the digestive system: Secondary | ICD-10-CM | POA: Insufficient documentation

## 2023-07-17 DIAGNOSIS — R748 Abnormal levels of other serum enzymes: Secondary | ICD-10-CM | POA: Diagnosis not present

## 2023-07-17 DIAGNOSIS — R7303 Prediabetes: Secondary | ICD-10-CM

## 2023-07-17 DIAGNOSIS — E349 Endocrine disorder, unspecified: Secondary | ICD-10-CM

## 2023-07-17 DIAGNOSIS — I700 Atherosclerosis of aorta: Secondary | ICD-10-CM

## 2023-07-17 DIAGNOSIS — Z9889 Other specified postprocedural states: Secondary | ICD-10-CM

## 2023-07-17 LAB — LIPID PANEL

## 2023-07-17 LAB — BAYER DCA HB A1C WAIVED: HB A1C (BAYER DCA - WAIVED): 6 % — ABNORMAL HIGH (ref 4.8–5.6)

## 2023-07-17 MED ORDER — GENOTROPIN MINIQUICK 0.2 MG ~~LOC~~ PRSY: PREFILLED_SYRINGE | SUBCUTANEOUS | 0 refills | Status: DC

## 2023-07-17 MED ORDER — TESTOSTERONE CYPIONATE 200 MG/ML IM SOLN
INTRAMUSCULAR | 0 refills | Status: DC
Start: 2023-07-17 — End: 2023-12-04

## 2023-07-17 NOTE — Progress Notes (Signed)
 Established Patient Office Visit  Subjective:  Patient ID: Spencer Stewart, male    DOB: 1956-05-08  Age: 68 y.o. MRN: [id number]  CC:  Chief Complaint  Patient presents with   Medical Management of Chronic Issues    HPI Spencer Stewart presents for chronic follow up. He reports doing well and has no concerns today.   Prediabetes/Cholesterol He continues to eat a healthy diet and exercises regularly.  Declined statin. He continues to be active and eat a healthy diet. He is fasting today.   2. Low testosterone  Reports can feel fatigue when injection is coming up do. He did take a break prior to surgery for 1 week.   3. Chronic low back pain Seeing chiropractor regularly. Stable. No red flags. He 3x a week takes ibuprofen if he going to be more active. .   4. Elevated liver enzymes Denies RUQ pain, nausea, vomiting, jaundice.   5. Hernia surgery Had surgery 10 days ago. He has been increasing his activity. Minor pain at incision site. No signs of infection, no fever. He has mesh placed.   Past Medical History:  Diagnosis Date   Hyperlipidemia     Past Surgical History:  Procedure Laterality Date   KNEE ARTHROSCOPY Left 2000   TONSILLECTOMY AND ADENOIDECTOMY      Family History  Problem Relation Age of Onset   Stroke Father    Colon cancer Cousin     Social History   Socioeconomic History   Marital status: Married    Spouse name: Not on file   Number of children: Not on file   Years of education: Not on file   Highest education level: Not on file  Occupational History   Not on file  Tobacco Use   Smoking status: Former   Smokeless tobacco: Former  Building Services Engineer status: Never Used  Substance and Sexual Activity   Alcohol use: Yes    Comment: twice weekly   Drug use: No   Sexual activity: Not on file  Other Topics Concern   Not on file  Social History Narrative   Not on file   Social Drivers of Health   Financial Resource Strain: Low Risk   (10/19/2022)   Overall Financial Resource Strain (CARDIA)    Difficulty of Paying Living Expenses: Not hard at all  Food Insecurity: No Food Insecurity (10/19/2022)   Hunger Vital Sign    Worried About Running Out of Food in the Last Year: Never true    Ran Out of Food in the Last Year: Never true  Transportation Needs: No Transportation Needs (10/19/2022)   PRAPARE - Administrator, Civil Service (Medical): No    Lack of Transportation (Non-Medical): No  Physical Activity: Sufficiently Active (10/19/2022)   Exercise Vital Sign    Days of Exercise per Week: 5 days    Minutes of Exercise per Session: 60 min  Stress: No Stress Concern Present (10/19/2022)   [employer] of Occupational Health - Occupational Stress Questionnaire    Feeling of Stress : Not at all  Social Connections: Socially Integrated (10/19/2022)   Social Connection and Isolation Panel [NHANES]    Frequency of Communication with Friends and Family: More than three times a week    Frequency of Social Gatherings with Friends and Family: More than three times a week    Attends Religious Services: More than 4 times per year    Active Member of [employer] or Organizations: Yes  Attends Banker Meetings: More than 4 times per year    Marital Status: Married  Catering Manager Violence: Not At Risk (10/19/2022)   Humiliation, Afraid, Rape, and Kick questionnaire    Fear of Current or Ex-Partner: No    Emotionally Abused: No    Physically Abused: No    Sexually Abused: No    Outpatient Medications Prior to Visit  Medication Sig Dispense Refill   aspirin 81 MG chewable tablet Chew by mouth daily.     cetirizine  (ZYRTEC ) 10 MG tablet Take 1 tablet (10 mg total) by mouth daily. 30 tablet 11   fluticasone  (FLONASE ) 50 MCG/ACT nasal spray Place 2 sprays into both nostrils daily. 16 g 6   Magnesium 400 MG TABS Take by mouth.     Somatropin  (GENOTROPIN  MINIQUICK) 0.2 MG PRSY INJECT 0.2MG  SQ DAILY 56 each 0    testosterone  cypionate (DEPOTESTOSTERONE CYPIONATE) 200 MG/ML injection INJECT 1ML EVERY 14 DAYS 2 mL 0   UNABLE TO FIND Med Name: flax seed , vit d, zinc and magnesium and BCAA branch-chain amino acids     meloxicam  (MOBIC ) 15 MG tablet Take 1 tablet (15 mg total) by mouth daily. 30 tablet 2   No facility-administered medications prior to visit.    Allergies  Allergen Reactions   Codeine    Crestor [Rosuvastatin] Other (See Comments)    Causes myalgia   Meloxicam  Other (See Comments)    Muscle pain    ROS Review of Systems Negative unless specially indicated above in HPI.   Objective:    Physical Exam Vitals and nursing note reviewed.  Constitutional:      General: He is not in acute distress.    Appearance: Normal appearance. He is not ill-appearing, toxic-appearing or diaphoretic.  HENT:     Head: Normocephalic and atraumatic.     Right Ear: Tympanic membrane, ear canal and external ear normal.     Left Ear: Tympanic membrane, ear canal and external ear normal.     Nose: Nose normal.  Neck:     Thyroid : No thyroid  mass, thyromegaly or thyroid  tenderness.  Cardiovascular:     Rate and Rhythm: Normal rate and regular rhythm.     Heart sounds: Normal heart sounds. No murmur heard. Pulmonary:     Effort: Pulmonary effort is normal. No respiratory distress.     Breath sounds: Normal breath sounds.  Abdominal:     General: Bowel sounds are normal. There is no distension.     Palpations: Abdomen is soft.     Tenderness: There is no abdominal tenderness. There is no guarding or rebound.  Musculoskeletal:     Right lower leg: No edema.     Left lower leg: No edema.  Skin:    General: Skin is warm and dry.     Coloration: Skin is not jaundiced.  Neurological:     General: No focal deficit present.     Mental Status: He is alert and oriented to person, place, and time.     Cranial Nerves: No cranial nerve deficit.     Motor: No weakness.     Coordination:  Coordination normal.     Gait: Gait normal.  Psychiatric:        Mood and Affect: Mood normal.        Behavior: Behavior normal.        Thought Content: Thought content normal.        Judgment: Judgment normal.     BP  134/80   Pulse 68   Temp 98.1 F (36.7 C) (Temporal)   Ht 5' 11 (1.803 m)   Wt 190 lb 6.4 oz (86.4 kg)   SpO2 97%   BMI 26.56 kg/m  Wt Readings from Last 3 Encounters:  07/17/23 190 lb 6.4 oz (86.4 kg)  05/23/23 195 lb (88.5 kg)  05/05/23 195 lb 3.2 oz (88.5 kg)      Assessment & Plan:   Aundrea Higginbotham was seen today for medical management of chronic issues.  Diagnoses and all orders for this visit:  Prediabetes Diet controlled. Continue exercise. Labs pending.  -     CBC with Differential/Platelet -     CMP14+EGFR -     TSH -     Bayer DCA Hb A1c Waived  Elevated liver enzymes Repeat labs pending. No symptoms.  -     CBC with Differential/Platelet -     CMP14+EGFR  Mixed hyperlipidemia Declined statin. Fasting labs pending.  -     Lipid panel  Aortic atherosclerosis (HCC) On aspirin. Declined statin.   Hypotestosteronemia Refill provided. Labs pending.  -     Testosterone ,Free and Total -     Somatropin  (GENOTROPIN  MINIQUICK) 0.2 MG PRSY; INJECT 0.2MG  SQ DAILY -     testosterone  cypionate (DEPOTESTOSTERONE CYPIONATE) 200 MG/ML injection; INJECT 1ML EVERY 14 DAYS  S/P hernia repair Doing well. No s/s of infection.     Follow-up: Return in about 6 months (around 01/14/2024) for CPE.  The patient indicates understanding of these issues and agrees with the plan.    Annabella CHRISTELLA Search, FNP

## 2023-07-19 LAB — CBC WITH DIFFERENTIAL/PLATELET
Basophils Absolute: 0.1 10*3/uL (ref 0.0–0.2)
Basos: 1 %
EOS (ABSOLUTE): 0.1 10*3/uL (ref 0.0–0.4)
Eos: 2 %
Hematocrit: 49.7 % (ref 37.5–51.0)
Hemoglobin: 16.9 g/dL (ref 13.0–17.7)
Immature Grans (Abs): 0.1 10*3/uL (ref 0.0–0.1)
Immature Granulocytes: 1 %
Lymphocytes Absolute: 1.7 10*3/uL (ref 0.7–3.1)
Lymphs: 29 %
MCH: 30.1 pg (ref 26.6–33.0)
MCHC: 34 g/dL (ref 31.5–35.7)
MCV: 88 fL (ref 79–97)
Monocytes Absolute: 0.6 10*3/uL (ref 0.1–0.9)
Monocytes: 11 %
Neutrophils Absolute: 3.3 10*3/uL (ref 1.4–7.0)
Neutrophils: 56 %
Platelets: 253 10*3/uL (ref 150–450)
RBC: 5.62 x10E6/uL (ref 4.14–5.80)
RDW: 12 % (ref 11.6–15.4)
WBC: 5.8 10*3/uL (ref 3.4–10.8)

## 2023-07-19 LAB — CMP14+EGFR
ALT: 43 IU/L (ref 0–44)
AST: 26 IU/L (ref 0–40)
Albumin: 4.3 g/dL (ref 3.9–4.9)
Alkaline Phosphatase: 118 IU/L (ref 44–121)
BUN/Creatinine Ratio: 18 (ref 10–24)
BUN: 17 mg/dL (ref 8–27)
Bilirubin Total: 0.4 mg/dL (ref 0.0–1.2)
CO2: 23 mmol/L (ref 20–29)
Calcium: 9.7 mg/dL (ref 8.6–10.2)
Chloride: 100 mmol/L (ref 96–106)
Creatinine, Ser: 0.97 mg/dL (ref 0.76–1.27)
Globulin, Total: 2.1 g/dL (ref 1.5–4.5)
Glucose: 158 mg/dL — ABNORMAL HIGH (ref 70–99)
Potassium: 4.5 mmol/L (ref 3.5–5.2)
Sodium: 138 mmol/L (ref 134–144)
Total Protein: 6.4 g/dL (ref 6.0–8.5)
eGFR: 86 mL/min/{1.73_m2} (ref >59)

## 2023-07-19 LAB — LIPID PANEL
Cholesterol, Total: 179 mg/dL (ref 100–199)
HDL: 36 mg/dL — ABNORMAL LOW (ref >39)
LDL CALC COMMENT:: 5 ratio (ref 0.0–5.0)
LDL Chol Calc (NIH): 127 mg/dL — ABNORMAL HIGH (ref 0–99)
Triglycerides: 85 mg/dL (ref 0–149)
VLDL Cholesterol Cal: 16 mg/dL (ref 5–40)

## 2023-07-19 LAB — TESTOSTERONE,FREE AND TOTAL
Testosterone, Free: 5.7 pg/mL — ABNORMAL LOW (ref 6.6–18.1)
Testosterone: 151 ng/dL — ABNORMAL LOW (ref 264–916)

## 2023-07-19 LAB — TSH: TSH: 1.76 u[IU]/mL (ref 0.450–4.500)

## 2023-10-30 ENCOUNTER — Ambulatory Visit: Payer: Self-pay

## 2023-10-30 ENCOUNTER — Ambulatory Visit (INDEPENDENT_AMBULATORY_CARE_PROVIDER_SITE_OTHER): Admitting: Family Medicine

## 2023-10-30 ENCOUNTER — Encounter: Payer: Self-pay | Admitting: Family Medicine

## 2023-10-30 VITALS — BP 128/76 | HR 69 | Temp 97.9°F | Ht 71.0 in | Wt 192.0 lb

## 2023-10-30 DIAGNOSIS — H6121 Impacted cerumen, right ear: Secondary | ICD-10-CM | POA: Diagnosis not present

## 2023-10-30 NOTE — Telephone Encounter (Signed)
  Chief Complaint: right ear congestion/pain Symptoms: right ear congestion and moderate pain Frequency: since Saturday afternoon Pertinent Negatives: Patient denies runny nose, cough, fever, discharge from ear. Disposition: [] ED /[] Urgent Care (no appt availability in office) / [x] Appointment(In office/virtual)/ []  [facility]/ [] Home Care/ [] Refused Recommended Disposition /[] [facility]/ []  Follow-up with PCP Additional Notes: Patient states he did some yard work on Saturday and cleaned his ears with Q tips after cleaning up/showering. He states after inserting the Q tip in his right ear "it plugged up". He states he has tried sweet oil and irrigation. Patient agreeable to acute visit, requesting to be seen today and scheduled with DOD Dr Steen Eden.  Copied from CRM [id number]. Topic: Clinical - Red Word Triage >> Oct 30, 2023  8:06 AM Spencer Stewart wrote: Red Word that prompted transfer to Nurse Triage: right ear completely plugged after cleaning yard on Saturday. cannot hear and beginning to hurt Reason for Disposition  Earache persists > 1 hour  Answer Assessment - Initial Assessment Questions 1. LOCATION: "Which ear is involved?"       Right.  2. SENSATION: "Describe how the ear feels." (e.g. stuffy, full, plugged)."      "Plugged up"  3. ONSET:  "When did the ear symptoms start?"       Saturday afternoon.  4. PAIN: "Do you also have an earache?" If Yes, ask: "How bad is it?" (Scale 1-10; or mild, moderate, severe)     Yes, moderate.  5. CAUSE: "What do you think is causing the ear congestion?"     Used a Q tip and feels like his caused an impaction.  6. URI: "Do you have a runny nose or cough?"      Denies.  7. NASAL ALLERGIES: "Are there symptoms of hay fever, such as sneezing or a clear nasal discharge?"     Denies.  8. PREGNANCY: "Is there any chance you are pregnant?" "When was your last menstrual period?"     N/A.  Protocols used: Ear -  Congestion-A-AH

## 2023-10-30 NOTE — Telephone Encounter (Signed)
 Appointment scheduled with DOD

## 2023-10-30 NOTE — Progress Notes (Signed)
 BP 128/76   Pulse 69   Temp 97.9 F (36.6 C)   Ht 5\' 11"  (1.803 m)   Wt 192 lb (87.1 kg)   SpO2 98%   BMI 26.78 kg/m    Subjective:   Patient ID: Spencer Stewart, male    DOB: 1955/07/29, 68 y.o.   MRN: [id number]  HPI: Spencer Stewart is a 68 y.o. male presenting on 10/30/2023 for Ear Injury (Right ear. Not hearing as well. Believes something may be in it.)   HPI Right ear plugged up Patient is coming in today because he has been having trouble with his right ear being plugged up and cannot hear out of it over the past couple days.  He says he was working outside 2 days ago and then came in and took a Q-tip and cleaned the left ear and then tried to clean the right ear but immediately felt it was plugged up, had a little bit of tenderness right away but no real pain.  He has not had any drainage or fevers or chills.  He has tried to irrigate at home with a bulb suction and tried using some alcohol drops and some sweet oil drops and has not changed.  He just feels like he cannot hear at all out of that side.  Relevant past medical, surgical, family and social history reviewed and updated as indicated. Interim medical history since our last visit reviewed. Allergies and medications reviewed and updated.  Review of Systems  Constitutional:  Negative for chills and fever.  HENT:  Positive for hearing loss. Negative for ear discharge, ear pain and facial swelling.   Respiratory:  Negative for shortness of breath and wheezing.   Cardiovascular:  Negative for chest pain and leg swelling.  Musculoskeletal:  Negative for back pain and gait problem.  Skin:  Negative for rash.  All other systems reviewed and are negative.   Per HPI unless specifically indicated above   Allergies as of 10/30/2023       Reactions   Codeine    Crestor [rosuvastatin] Other (See Comments)   Causes myalgia   Meloxicam  Other (See Comments)   Muscle pain        Medication List        Accurate as of  October 30, 2023 12:21 PM. If you have any questions, ask your nurse or doctor.          aspirin 81 MG chewable tablet Chew by mouth daily.   cetirizine  10 MG tablet Commonly known as: ZYRTEC  Take 1 tablet (10 mg total) by mouth daily.   fluticasone  50 MCG/ACT nasal spray Commonly known as: FLONASE  Place 2 sprays into both nostrils daily.   Genotropin  MiniQuick 0.2 MG Prsy Generic drug: Somatropin  INJECT 0.2MG  SQ DAILY   Magnesium 400 MG Tabs Take by mouth.   testosterone  cypionate 200 MG/ML injection Commonly known as: DEPOTESTOSTERONE CYPIONATE INJECT 1ML EVERY 14 DAYS   UNABLE TO FIND Med Name: flax seed , vit d, zinc and magnesium and BCAA branch-chain amino acids         Objective:   BP 128/76   Pulse 69   Temp 97.9 F (36.6 C)   Ht 5\' 11"  (1.803 m)   Wt 192 lb (87.1 kg)   SpO2 98%   BMI 26.78 kg/m   Wt Readings from Last 3 Encounters:  10/30/23 192 lb (87.1 kg)  07/17/23 190 lb 6.4 oz (86.4 kg)  05/23/23 195 lb (88.5 kg)  Physical Exam Vitals and nursing note reviewed.  Constitutional:      Appearance: Normal appearance. He is normal weight.  HENT:     Right Ear: No drainage or swelling. There is impacted cerumen.     Left Ear: Tympanic membrane and ear canal normal.  Neurological:     Mental Status: He is alert.    Cerumen impaction-nurse to lavage, patient tolerated well.    Assessment & Plan:   Problem List Items Addressed This Visit   None Visit Diagnoses       Impacted cerumen of right ear    -  Primary       After lavage he felt like he was hearing a lot better. Follow up plan: Return if symptoms worsen or fail to improve.  Counseling provided for all of the vaccine components No orders of the defined types were placed in this encounter.   Jolyne Needs, MD [facility] 10/30/2023, 12:21 PM

## 2023-11-30 ENCOUNTER — Other Ambulatory Visit: Payer: Self-pay | Admitting: Family Medicine

## 2023-11-30 DIAGNOSIS — E349 Endocrine disorder, unspecified: Secondary | ICD-10-CM

## 2023-12-14 ENCOUNTER — Other Ambulatory Visit: Payer: Self-pay | Admitting: Family Medicine

## 2023-12-14 DIAGNOSIS — E349 Endocrine disorder, unspecified: Secondary | ICD-10-CM

## 2024-01-10 ENCOUNTER — Ambulatory Visit

## 2024-01-10 VITALS — BP 128/76 | HR 69 | Ht 71.0 in | Wt 192.0 lb

## 2024-01-10 DIAGNOSIS — Z0000 Encounter for general adult medical examination without abnormal findings: Secondary | ICD-10-CM

## 2024-01-10 NOTE — Progress Notes (Signed)
 Subjective:   Spencer Stewart is a 68 y.o. who presents for a Medicare Wellness preventive visit.  As a reminder, Annual Wellness Visits don't include a physical exam, and some assessments may be limited, especially if this visit is performed virtually. We may recommend an in-person follow-up visit with your provider if needed.  Visit Complete: Virtual I connected with  Spencer Stewart on 01/10/24 by a audio enabled telemedicine application and verified that I am speaking with the correct person using two identifiers.  Patient Location: Home  Provider Location: Home Office  I discussed the limitations of evaluation and management by telemedicine. The patient expressed understanding and agreed to proceed.  Vital Signs: Because this visit was a virtual/telehealth visit, some criteria may be missing or patient reported. Any vitals not documented were not able to be obtained and vitals that have been documented are patient reported.  VideoDeclined- This patient declined Librarian, academic. Therefore the visit was completed with audio only.  Persons Participating in Visit: Patient.  AWV Questionnaire: No: Patient Medicare AWV questionnaire was not completed prior to this visit.  Cardiac Risk Factors include: advanced age (>55men, >57 women);dyslipidemia;male gender     Objective:    Today's Vitals   01/10/24 1314  BP: 128/76  Pulse: 69  Weight: 192 lb (87.1 kg)  Height: 5' 11 (1.803 m)   Body mass index is 26.78 kg/m.     01/10/2024    1:21 PM 10/19/2022   12:10 PM 09/28/2021   11:27 AM 05/19/2016    9:52 AM 02/03/2015    5:09 PM  Advanced Directives  Does Patient Have a Medical Advance Directive? No Yes No Yes  No   Type of Special educational needs teacher of [place];Living will  Healthcare Power of [place];Living will    Copy of Healthcare Power of Attorney in Chart?  No - copy requested  No - copy requested    Would patient like information on  creating a medical advance directive?   No - Patient declined       Data saved with a previous flowsheet row definition    Current Medications (verified) Outpatient Encounter Medications as of 01/10/2024  Medication Sig   aspirin 81 MG chewable tablet Chew by mouth daily.   GENOTROPIN  MINIQUICK 0.2 MG PRSY INJECT 0.2MG  SQ DAILY   Magnesium 400 MG TABS Take by mouth.   testosterone  cypionate (DEPOTESTOSTERONE CYPIONATE) 200 MG/ML injection INJECT 1ML EVERY 14 DAYS   UNABLE TO FIND Med Name: flax seed , vit d, zinc and magnesium and BCAA branch-chain amino acids   cetirizine  (ZYRTEC ) 10 MG tablet Take 1 tablet (10 mg total) by mouth daily. (Patient not taking: Reported on 01/10/2024)   fluticasone  (FLONASE ) 50 MCG/ACT nasal spray Place 2 sprays into both nostrils daily. (Patient not taking: Reported on 01/10/2024)   No facility-administered encounter medications on file as of 01/10/2024.    Allergies (verified) Codeine, Crestor [rosuvastatin], and Meloxicam    History: Past Medical History:  Diagnosis Date   Hyperlipidemia    Past Surgical History:  Procedure Laterality Date   HERNIA REPAIR     KNEE ARTHROSCOPY Left 07/04/1998   TONSILLECTOMY AND ADENOIDECTOMY     Family History  Problem Relation Age of Onset   Stroke Father    Colon cancer Cousin    Social History   Socioeconomic History   Marital status: Married    Spouse name: Not on file   Number of children: Not on file  Years of education: Not on file   Highest education level: Not on file  Occupational History   Not on file  Tobacco Use   Smoking status: Former   Smokeless tobacco: Former  Advertising account planner   Vaping status: Never Used  Substance and Sexual Activity   Alcohol use: Yes    Comment: twice weekly   Drug use: No   Sexual activity: Not on file  Other Topics Concern   Not on file  Social History Narrative   Not on file   Social Drivers of Health   Financial Resource Strain: Low Risk  (01/10/2024)    Overall Financial Resource Strain (CARDIA)    Difficulty of Paying Living Expenses: Not hard at all  Food Insecurity: No Food Insecurity (01/10/2024)   Hunger Vital Sign    Worried About Running Out of Food in the Last Year: Never true    Ran Out of Food in the Last Year: Never true  Transportation Needs: No Transportation Needs (01/10/2024)   PRAPARE - Administrator, Civil Service (Medical): No    Lack of Transportation (Non-Medical): No  Physical Activity: Sufficiently Active (01/10/2024)   Exercise Vital Sign    Days of Exercise per Week: 5 days    Minutes of Exercise per Session: 60 min  Stress: No Stress Concern Present (01/10/2024)   [employer] of Occupational Health - Occupational Stress Questionnaire    Feeling of Stress: Not at all  Social Connections: Socially Integrated (01/10/2024)   Social Connection and Isolation Panel    Frequency of Communication with Friends and Family: More than three times a week    Frequency of Social Gatherings with Friends and Family: More than three times a week    Attends Religious Services: More than 4 times per year    Active Member of [employer] or Organizations: Yes    Attends Banker Meetings: 1 to 4 times per year    Marital Status: Married    Tobacco Counseling Counseling given: Yes    Clinical Intake:  Pre-visit preparation completed: Yes  Pain : No/denies pain     BMI - recorded: 26.78 Nutritional Status: BMI 25 -29 Overweight Nutritional Risks: None Diabetes: No  Lab Results  Component Value Date   HGBA1C 6.0 (H) 07/17/2023   HGBA1C 5.9 (H) 01/13/2023   HGBA1C 5.9 (H) 06/02/2022     How often do you need to have someone help you when you read instructions, pamphlets, or other written materials from your doctor or pharmacy?: 1 - Never  Interpreter Needed?: No  Information entered by :: alia t/cma   Activities of Daily Living     01/10/2024    1:19 PM  In your present state of health, do you  have any difficulty performing the following activities:  Hearing? 0  Vision? 0  Difficulty concentrating or making decisions? 0  Walking or climbing stairs? 0  Dressing or bathing? 0  Doing errands, shopping? 0  Preparing Food and eating ? N  Using the Toilet? N  In the past six months, have you accidently leaked urine? N  Do you have problems with loss of bowel control? N  Managing your Medications? N  Managing your Finances? N  Housekeeping or managing your Housekeeping? N    Patient Care Team: Joesph Annabella HERO, FNP as PCP - General (Family Medicine)  I have updated your Care Teams any recent Medical Services you may have received from other providers in the past year.  Assessment:   This is a routine wellness examination for Markie.  Hearing/Vision screen Hearing Screening - Comments:: Pt denies hearing dif Vision Screening - Comments:: Pt denies vision dif/dr. [place] in [place] a yr ago   Goals Addressed             This Visit's Progress    physical activity   On track    Increase physical activity by walking 3x a week       Depression Screen     01/10/2024    1:23 PM 10/30/2023   11:48 AM 07/17/2023    9:12 AM 05/05/2023    9:48 AM 01/13/2023    8:15 AM 10/19/2022   12:09 PM 06/01/2022   10:24 AM  PHQ 2/9 Scores  PHQ - 2 Score 0 0 0 0 0 0 0  PHQ- 9 Score   0 0 0  1    Fall Risk     01/10/2024    1:16 PM 10/30/2023   11:48 AM 07/17/2023    9:12 AM 05/05/2023    9:48 AM 01/13/2023    8:15 AM  Fall Risk   Falls in the past year? 0 0 0 0 0  Number falls in past yr: 0 0  1   Injury with Fall? 0 0  0   Risk for fall due to : No Fall Risks No Fall Risks     Follow up Falls evaluation completed Falls evaluation completed   Falls evaluation completed    MEDICARE RISK AT HOME:  Medicare Risk at Home Any stairs in or around the home?: Yes If so, are there any without handrails?: Yes Home free of loose throw rugs in walkways, pet beds, electrical  cords, etc?: Yes Adequate lighting in your home to reduce risk of falls?: Yes Life alert?: No Use of a cane, walker or w/c?: No Grab bars in the bathroom?: No Shower chair or bench in shower?: No Elevated toilet seat or a handicapped toilet?: No  TIMED UP AND GO:  Was the test performed?  no  Cognitive Function: 6CIT completed    09/28/2021   10:57 AM  MMSE - Mini Mental State Exam  Orientation to time 5  Orientation to Place 5  Registration 3  Attention/ Calculation 5  Recall 3  Language- name 2 objects 2  Language- repeat 1  Language- follow 3 step command 3  Language- read & follow direction 1  Write a sentence 1  Copy design 1  Total score 30        10/19/2022   12:11 PM 09/28/2021   11:21 AM  6CIT Screen  What Year? 0 points 0 points  What month? 0 points 0 points  What time? 0 points 0 points  Count back from 20 0 points 0 points  Months in reverse 0 points 0 points  Repeat phrase 0 points 0 points  Total Score 0 points 0 points    Immunizations Immunization History  Administered Date(s) Administered   Tdap 02/23/2011    Screening Tests Health Maintenance  Topic Date Due   DTaP/Tdap/Td (2 - Td or Tdap) 02/22/2021   Pneumococcal Vaccine: 50+ Years (1 of 1 - PCV) 07/16/2024 (Originally 11/17/2005)   COVID-19 Vaccine (1 - 2024-25 season) 08/01/2024 (Originally 03/05/2023)   Zoster Vaccines- Shingrix (1 of 2) 10/14/2024 (Originally 11/17/2005)   INFLUENZA VACCINE  02/02/2024   Medicare Annual Wellness (AWV)  01/09/2025   Colonoscopy  06/02/2026   Hepatitis C Screening  Completed  Hepatitis B Vaccines  Aged Out   HPV VACCINES  Aged Out   Meningococcal B Vaccine  Aged Out    Health Maintenance  Health Maintenance Due  Topic Date Due   DTaP/Tdap/Td (2 - Td or Tdap) 02/22/2021   Health Maintenance Items Addressed: See Nurse Notes at the end of this note  Additional Screening:  Vision Screening: Recommended annual ophthalmology exams for early  detection of glaucoma and other disorders of the eye. Would you like a referral to an eye doctor? No    Dental Screening: Recommended annual dental exams for proper oral hygiene  Community Resource Referral / Chronic Care Management: CRR required this visit?  No   CCM required this visit?  No   Plan:    I have personally reviewed and noted the following in the patient's chart:   Medical and social history Use of alcohol, tobacco or illicit drugs  Current medications and supplements including opioid prescriptions. Patient is not currently taking opioid prescriptions. Functional ability and status Nutritional status Physical activity Advanced directives List of other physicians Hospitalizations, surgeries, and ER visits in previous 12 months Vitals Screenings to include cognitive, depression, and falls Referrals and appointments  In addition, I have reviewed and discussed with patient certain preventive protocols, quality metrics, and best practice recommendations. A written personalized care plan for preventive services as well as general preventive health recommendations were provided to patient.   Ozie Ned, CMA   01/10/2024   After Visit Summary: (MyChart) Due to this being a telephonic visit, the after visit summary with patients personalized plan was offered to patient via MyChart   Notes:  Patient is aware and due for Tetanus vaccine. Will get it at next office visit with provider.

## 2024-01-10 NOTE — Patient Instructions (Signed)
 Spencer Stewart , Thank you for taking time out of your busy schedule to complete your Annual Wellness Visit with me. I enjoyed our conversation and look forward to speaking with you again next year. I, as well as your care team,  appreciate your ongoing commitment to your health goals. Please review the following plan we discussed and let me know if I can assist you in the future. Your Game plan/ To Do List    Follow up Visits: Next Medicare AWV with our clinical staff: 01/13/25 at 12:30p.m.    Next Office Visit with your provider: 05/25/24 at 9:00a.m.  Clinician Recommendations:  Aim for 30 minutes of exercise or brisk walking, 6-8 glasses of water, and 5 servings of fruits and vegetables each day. Patient is aware and due for Tetanus vaccine. Will get it at next office visit with provider.       This is a list of the screening recommended for you and due dates:  Health Maintenance  Topic Date Due   DTaP/Tdap/Td vaccine (2 - Td or Tdap) 02/22/2021   Medicare Annual Wellness Visit  10/19/2023   Pneumococcal Vaccine for age over 54 (1 of 1 - PCV) 07/16/2024*   COVID-19 Vaccine (1 - 2024-25 season) 08/01/2024*   Zoster (Shingles) Vaccine (1 of 2) 10/14/2024*   Flu Shot  02/02/2024   Colon Cancer Screening  06/02/2026   Hepatitis C Screening  Completed   Hepatitis B Vaccine  Aged Out   HPV Vaccine  Aged Out   Meningitis B Vaccine  Aged Out  *Topic was postponed. The date shown is not the original due date.    Advanced directives: (Declined) Advance directive discussed with you today. Even though you declined this today, please call our office should you change your mind, and we can give you the proper paperwork for you to fill out. Advance Care Planning is important because it:  [x]  Makes sure you receive the medical care that is consistent with your values, goals, and preferences  [x]  It provides guidance to your family and loved ones and reduces their decisional burden about whether or  not they are making the right decisions based on your wishes.  Follow the link provided in your after visit summary or read over the paperwork we have mailed to you to help you started getting your Advance Directives in place. If you need assistance in completing these, please reach out to us  so that we can help you!  See attachments for Preventive Care and Fall Prevention Tips.

## 2024-01-17 ENCOUNTER — Encounter: Payer: Medicare Other | Admitting: Family Medicine

## 2024-02-18 ENCOUNTER — Other Ambulatory Visit: Payer: Self-pay | Admitting: Family Medicine

## 2024-02-18 DIAGNOSIS — E349 Endocrine disorder, unspecified: Secondary | ICD-10-CM

## 2024-05-16 ENCOUNTER — Other Ambulatory Visit: Payer: Self-pay | Admitting: Family Medicine

## 2024-05-16 DIAGNOSIS — E349 Endocrine disorder, unspecified: Secondary | ICD-10-CM

## 2024-05-17 ENCOUNTER — Encounter: Payer: Self-pay | Admitting: Family Medicine

## 2024-05-17 ENCOUNTER — Ambulatory Visit: Payer: Self-pay | Admitting: Family Medicine

## 2024-05-17 VITALS — BP 138/71 | HR 80 | Temp 98.5°F | Ht 71.0 in | Wt 194.4 lb

## 2024-05-17 DIAGNOSIS — R7303 Prediabetes: Secondary | ICD-10-CM

## 2024-05-17 DIAGNOSIS — E782 Mixed hyperlipidemia: Secondary | ICD-10-CM | POA: Diagnosis not present

## 2024-05-17 DIAGNOSIS — E349 Endocrine disorder, unspecified: Secondary | ICD-10-CM | POA: Diagnosis not present

## 2024-05-17 DIAGNOSIS — Z0000 Encounter for general adult medical examination without abnormal findings: Secondary | ICD-10-CM

## 2024-05-17 DIAGNOSIS — I700 Atherosclerosis of aorta: Secondary | ICD-10-CM

## 2024-05-17 LAB — BAYER DCA HB A1C WAIVED: HB A1C (BAYER DCA - WAIVED): 6.2 % — ABNORMAL HIGH (ref 4.8–5.6)

## 2024-05-17 MED ORDER — TESTOSTERONE CYPIONATE 200 MG/ML IM SOLN: INTRAMUSCULAR | 2 refills | Status: AC

## 2024-05-17 NOTE — Progress Notes (Addendum)
 Complete physical exam  Patient: Spencer Stewart   DOB: [birth year]   68 y.o. Male  MRN: [id number]  Subjective:    Chief Complaint  Patient presents with   Annual Exam    [name] is a 68 y.o. male who presents today for a complete physical exam. He reports consuming a well balanced diet. Gym/ health club routine includes cardio and mod to heavy weightlifting. He generally feels well. He reports sleeping well.   Physical activity and exercise tolerance - Resumed usual activities including weightlifting and cardio exercises - Walking is the primary form of cardiovascular exercise - Remains active by working on his property - No chest pain, shortness of breath, or peripheral edema  Diet and sleep patterns - Diet described as good, consisting of simple foods - Sleeping well without disturbances  low testosterone  - Due for his injection tomorrow - Feels low energy when injection is due       Most recent fall risk assessment:    05/17/2024    9:01 AM  Fall Risk   Falls in the past year? 0     Most recent depression screenings:    05/17/2024    9:01 AM 01/10/2024    1:23 PM  PHQ 2/9 Scores  PHQ - 2 Score 0 0  PHQ- 9 Score 0     Vision:Within last year and Dental: No current dental problems and Receives regular dental care    Patient Care Team: Joesph Annabella HERO, FNP as PCP - General (Family Medicine)   Outpatient Medications Prior to Visit  Medication Sig   aspirin 81 MG chewable tablet Chew by mouth daily.   cetirizine  (ZYRTEC ) 10 MG tablet Take 1 tablet (10 mg total) by mouth daily.   fluticasone  (FLONASE ) 50 MCG/ACT nasal spray Place 2 sprays into both nostrils daily.   GENOTROPIN  MINIQUICK 0.2 MG PRSY INJECT 0.2MG  SQ DAILY   Magnesium 400 MG TABS Take by mouth.   testosterone  cypionate (DEPOTESTOSTERONE CYPIONATE) 200 MG/ML injection INJECT 1ML EVERY 14 DAYS   UNABLE TO FIND Med Name: flax seed , vit d, zinc and magnesium and BCAA branch-chain amino  acids   VITAMIN D  PO Take by mouth.   No facility-administered medications prior to visit.    ROS Negative unless specially indicated above in HPI.     Objective:     BP 138/71   Pulse 80   Temp 98.5 F (36.9 C) (Temporal)   Ht 5' 11 (1.803 m)   Wt 194 lb 6.4 oz (88.2 kg)   SpO2 97%   BMI 27.11 kg/m    Physical Exam Vitals and nursing note reviewed.  Constitutional:      General: He is not in acute distress.    Appearance: He is not ill-appearing, toxic-appearing or diaphoretic.  HENT:     Head: Normocephalic.     Right Ear: Tympanic membrane, ear canal and external ear normal.     Left Ear: Tympanic membrane, ear canal and external ear normal.     Nose: Nose normal.     Mouth/Throat:     Mouth: Mucous membranes are moist.     Pharynx: Oropharynx is clear.  Eyes:     Extraocular Movements: Extraocular movements intact.     Conjunctiva/sclera: Conjunctivae normal.     Pupils: Pupils are equal, round, and reactive to light.  Neck:     Thyroid : No thyroid  mass, thyromegaly or thyroid  tenderness.  Cardiovascular:     Rate and Rhythm:  Normal rate and regular rhythm.     Pulses: Normal pulses.     Heart sounds: Normal heart sounds. No murmur heard.    No friction rub. No gallop.  Pulmonary:     Effort: Pulmonary effort is normal.     Breath sounds: Normal breath sounds.  Abdominal:     General: Bowel sounds are normal. There is no distension.     Palpations: Abdomen is soft. There is no mass.     Tenderness: There is no abdominal tenderness. There is no guarding.  Musculoskeletal:     Cervical back: Normal range of motion and neck supple. No tenderness.     Right lower leg: No edema.     Left lower leg: No edema.  Skin:    General: Skin is warm and dry.     Capillary Refill: Capillary refill takes less than 2 seconds.     Findings: No lesion or rash.  Neurological:     General: No focal deficit present.     Mental Status: He is alert and oriented to person,  place, and time.  Psychiatric:        Mood and Affect: Mood normal.        Behavior: Behavior normal.        Thought Content: Thought content normal.      No results found for any visits on 05/17/24.     Assessment & Plan:    Routine Health Maintenance and Physical Exam  [name] was seen today for annual exam.  Diagnoses and all orders for this visit:  Routine general medical examination at a health care facility  Prediabetes -     Bayer DCA Hb A1c Waived  Mixed hyperlipidemia  Aortic atherosclerosis  Hypotestosteronemia -     testosterone  cypionate (DEPOTESTOSTERONE CYPIONATE) 200 MG/ML injection; INJECT 1ML EVERY 14 DAYS   Assessment and Plan    Adult Wellness Visit Routine wellness visit with no acute concerns. - Ordered lab work including A1c. - Scheduled follow-up in 6 months.  Prediabetes Due for A1c testing to monitor glucose levels. - Ordered A1c test.  Mixed hyperlipidemia Cholesterol levels checked earlier this year. No new concerns reported.     Aortic atherosclerosis Continue aspirin  Hypotestosteronemia PDMP reviewed, no red flags. CSA is UTD. Refills provided. Injection is due tomorrow so did not repeat levels as last level was low.   Immunization History  Administered Date(s) Administered   Tdap 02/23/2011    Health Maintenance  Topic Date Due   DTaP/Tdap/Td (2 - Td or Tdap) 02/22/2021   Influenza Vaccine  Never done   COVID-19 Vaccine (1 - 2025-26 season) Never done   Pneumococcal Vaccine: 50+ Years (1 of 1 - PCV) 07/16/2024 (Originally 11/17/2005)   Zoster Vaccines- Shingrix (1 of 2) 10/14/2024 (Originally 11/17/2005)   Medicare Annual Wellness (AWV)  01/09/2025   Colonoscopy  06/02/2026   Hepatitis C Screening  Completed   Meningococcal B Vaccine  Aged Out    Discussed health benefits of physical activity, and encouraged him to engage in regular exercise appropriate for his age and condition.  Problem List Items Addressed  This Visit       Cardiovascular and Mediastinum   Aortic atherosclerosis     Other   Hyperlipidemia   Prediabetes   Relevant Orders   Bayer DCA Hb A1c Waived   Hypotestosteronemia   Relevant Medications   testosterone  cypionate (DEPOTESTOSTERONE CYPIONATE) 200 MG/ML injection   Other Visit Diagnoses  Routine general medical examination at a health care facility    -  Primary      Return in about 6 months (around 11/14/2024) for chronic follow up.   The patient indicates understanding of these issues and agrees with the plan.  Annabella CHRISTELLA Search, FNP

## 2024-05-17 NOTE — Patient Instructions (Signed)
 Health Maintenance, Male  Adopting a healthy lifestyle and getting preventive care are important in promoting health and wellness. Ask your health care provider about:  The right schedule for you to have regular tests and exams.  Things you can do on your own to prevent diseases and keep yourself healthy.  What should I know about diet, weight, and exercise?  Eat a healthy diet    Eat a diet that includes plenty of vegetables, fruits, low-fat dairy products, and lean protein.  Do not eat a lot of foods that are high in solid fats, added sugars, or sodium.  Maintain a healthy weight  Body mass index (BMI) is a measurement that can be used to identify possible weight problems. It estimates body fat based on height and weight. Your health care provider can help determine your BMI and help you achieve or maintain a healthy weight.  Get regular exercise  Get regular exercise. This is one of the most important things you can do for your health. Most adults should:  Exercise for at least 150 minutes each week. The exercise should increase your heart rate and make you sweat (moderate-intensity exercise).  Do strengthening exercises at least twice a week. This is in addition to the moderate-intensity exercise.  Spend less time sitting. Even light physical activity can be beneficial.  Watch cholesterol and blood lipids  Have your blood tested for lipids and cholesterol at 68 years of age, then have this test every 5 years.  You may need to have your cholesterol levels checked more often if:  Your lipid or cholesterol levels are high.  You are older than 68 years of age.  You are at high risk for heart disease.  What should I know about cancer screening?  Many types of cancers can be detected early and may often be prevented. Depending on your health history and family history, you may need to have cancer screening at various ages. This may include screening for:  Colorectal cancer.  Prostate cancer.  Skin cancer.  Lung  cancer.  What should I know about heart disease, diabetes, and high blood pressure?  Blood pressure and heart disease  High blood pressure causes heart disease and increases the risk of stroke. This is more likely to develop in people who have high blood pressure readings or are overweight.  Talk with your health care provider about your target blood pressure readings.  Have your blood pressure checked:  Every 3-5 years if you are 24-52 years of age.  Every year if you are 3 years old or older.  If you are between the ages of 60 and 72 and are a current or former smoker, ask your health care provider if you should have a one-time screening for abdominal aortic aneurysm (AAA).  Diabetes  Have regular diabetes screenings. This checks your fasting blood sugar level. Have the screening done:  Once every three years after age 66 if you are at a normal weight and have a low risk for diabetes.  More often and at a younger age if you are overweight or have a high risk for diabetes.  What should I know about preventing infection?  Hepatitis B  If you have a higher risk for hepatitis B, you should be screened for this virus. Talk with your health care provider to find out if you are at risk for hepatitis B infection.  Hepatitis C  Blood testing is recommended for:  Everyone born from [birth year] through 1965.  Anyone  with known risk factors for hepatitis C.  Sexually transmitted infections (STIs)  You should be screened each year for STIs, including gonorrhea and chlamydia, if:  You are sexually active and are younger than 68 years of age.  You are older than 68 years of age and your health care provider tells you that you are at risk for this type of infection.  Your sexual activity has changed since you were last screened, and you are at increased risk for chlamydia or gonorrhea. Ask your health care provider if you are at risk.  Ask your health care provider about whether you are at high risk for HIV. Your health care provider  may recommend a prescription medicine to help prevent HIV infection. If you choose to take medicine to prevent HIV, you should first get tested for HIV. You should then be tested every 3 months for as long as you are taking the medicine.  Follow these instructions at home:  Alcohol use  Do not drink alcohol if your health care provider tells you not to drink.  If you drink alcohol:  Limit how much you have to 0-2 drinks a day.  Know how much alcohol is in your drink. In the [place] one drink equals one 12 oz bottle of beer (355 mL), one 5 oz glass of wine (148 mL), or one 1 oz glass of hard liquor (44 mL).  Lifestyle  Do not use any products that contain nicotine or tobacco. These products include cigarettes, chewing tobacco, and vaping devices, such as e-cigarettes. If you need help quitting, ask your health care provider.  Do not use street drugs.  Do not share needles.  Ask your health care provider for help if you need support or information about quitting drugs.  General instructions  Schedule regular health, dental, and eye exams.  Stay current with your vaccines.  Tell your health care provider if:  You often feel depressed.  You have ever been abused or do not feel safe at home.  Summary  Adopting a healthy lifestyle and getting preventive care are important in promoting health and wellness.  Follow your health care provider's instructions about healthy diet, exercising, and getting tested or screened for diseases.  Follow your health care provider's instructions on monitoring your cholesterol and blood pressure.  This information is not intended to replace advice given to you by your health care provider. Make sure you discuss any questions you have with your health care provider.  Document Revised: 11/09/2020 Document Reviewed: 11/09/2020  Elsevier Patient Education  2024 [employer].

## 2024-05-20 ENCOUNTER — Ambulatory Visit: Payer: Self-pay | Admitting: Family Medicine

## 2024-05-24 NOTE — Addendum Note (Signed)
 Addended by: JOESPH ANNABELLA HERO on: 05/24/2024 04:32 PM   Modules accepted: Level of Service

## 2024-07-03 ENCOUNTER — Ambulatory Visit: Admitting: Family Medicine

## 2024-07-03 ENCOUNTER — Encounter: Payer: Self-pay | Admitting: Family Medicine

## 2024-07-03 VITALS — BP 145/79 | HR 76 | Ht 71.0 in | Wt 194.0 lb

## 2024-07-03 DIAGNOSIS — R21 Rash and other nonspecific skin eruption: Secondary | ICD-10-CM

## 2024-07-03 MED ORDER — NYSTATIN 100000 UNIT/GM EX CREA: 1.0000 | TOPICAL_CREAM | Freq: Two times a day (BID) | CUTANEOUS | 2 refills | Status: AC

## 2024-07-03 NOTE — Progress Notes (Signed)
 "  BP (!) 145/79   Pulse 76   Ht 5' 11 (1.803 m)   Wt 194 lb (88 kg)   SpO2 97%   BMI 27.06 kg/m    Subjective:   Patient ID: Spencer Stewart, male    DOB: 05/29/1956, 68 y.o.   MRN: [id number]  HPI: Spencer Stewart is a 68 y.o. male presenting on 07/03/2024 for lesion on penis (Present for one year. Flares on/off. States he removed embedded tick one year ago.)   Discussed the use of AI scribe software for clinical note transcription with the patient, who gave verbal consent to proceed.  History of Present Illness   Spencer Stewart is a 68 year old male who presents with a recurring lesion on the penis.  Penile lesion - Recurring lesion on the penis for approximately two years, intermittently resolving and reappearing - Initial onset followed a tick bite on the rim of the penis two and a half years ago, removed with tweezers, leaving an indention and mild swelling that resolved - Lesion reappeared before Thanksgiving and began to spread, associated with burning sensation - Application of Neosporin without noticeable improvement - No pain during intercourse  Urinary symptoms - Occasional burning sensation during urination - No hematuria  Systemic symptoms - No fever, chills, or body aches  Arthralgia - Ongoing joint pain attributed to arthritis  History of tick-borne illness - [facility]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 Interim medical history since our last visit reviewed. Allergies and medications reviewed and updated.  Review of Systems  Genitourinary:  Negative for penile discharge, penile pain and penile swelling.  Skin:  Positive for color change and rash.    Per HPI unless specifically indicated above   Allergies as of 07/03/2024       Reactions   Codeine    Crestor [rosuvastatin] Other (See Comments)   Causes myalgia   Meloxicam  Other (See Comments)   Muscle pain        Medication List        Accurate as of July 03, 2024  9:34 AM. If you have any questions, ask your nurse or doctor.          aspirin 81 MG chewable tablet Chew by mouth daily.   cetirizine  10 MG tablet Commonly known as: ZYRTEC  Take 1 tablet (10 mg total) by mouth daily.   fluticasone  50 MCG/ACT nasal spray Commonly known as: FLONASE  Place 2 sprays into both nostrils daily.   Genotropin  MiniQuick 0.2 MG Prsy Generic drug: Somatropin  INJECT 0.2MG  SQ DAILY   Magnesium 400 MG Tabs Take by mouth.   nystatin cream Commonly known as: MYCOSTATIN Apply 1 Application topically 2 (two) times daily. Started by: Fonda Levins, MD   testosterone  cypionate 200 MG/ML injection Commonly known as: DEPOTESTOSTERONE CYPIONATE INJECT 1ML EVERY 14 DAYS   UNABLE TO FIND Med Name: flax seed , vit d, zinc and  magnesium and BCAA branch-chain amino acids   VITAMIN D  PO Take by mouth.         Objective:   BP (!) 145/79   Pulse 76   Ht 5' 11 (1.803 m)   Wt 194 lb (88 kg)   SpO2 97%   BMI 27.06 kg/m   Wt Readings from Last 3 Encounters:  07/03/24 194 lb (88 kg)  05/17/24 194 lb 6.4 oz (88.2 kg)  01/10/24 192 lb (87.1 kg)    Physical Exam Vitals and nursing note reviewed.    Physical Exam   GENITOURINARY: Lesion on the rim of the glans penis with associated indention and swelling. The lesion has spread.  Scaly slightly darker discoloration of the  skin on the dorsum of the head of his penis about 0.25 cm in diameter        Assessment & Plan:   Problem List Items Addressed This Visit   None Visit Diagnoses       Penile rash    -  Primary   Relevant Medications   nystatin cream (MYCOSTATIN)   Other Relevant Orders   CMP14+EGFR   CBC With Diff/Platelet   Alpha-Gal Panel   Spotted Fever Group Antibodies   Lyme Disease Serology w/Reflex           Recurrent penile lesion at prior tick bite site Recurrent lesion with burning sensation, atypical for [facility] spotted fever. No systemic symptoms of active infection.     Has a fungal appearance but will go ahead and testing for Lyme disease [facility] spotted fever and have him do topical nystatin twice daily and if does not show any improvement then he will need to go see dermatology.     Follow up plan: No follow-ups on file.  Counseling provided for all of the vaccine components Orders Placed This Encounter  Procedures   CMP14+EGFR   CBC With Diff/Platelet   Alpha-Gal Panel   Spotted Fever Group Antibodies   Lyme Disease Serology w/Reflex    Fonda Levins, MD [facility] 07/03/2024, 9:34 AM     "

## 2024-07-04 LAB — SPOTTED FEVER GROUP ANTIBODIES
Spotted Fever Group IgG: <1:64 {titer}
Spotted Fever Group IgM: <1:64 {titer}

## 2024-07-06 LAB — CBC WITH DIFF/PLATELET
Basophils Absolute: 0.1 x10E3/uL (ref 0.0–0.2)
Basos: 1 %
EOS (ABSOLUTE): 0.1 x10E3/uL (ref 0.0–0.4)
Eos: 2 %
Hematocrit: 51.3 % — ABNORMAL HIGH (ref 37.5–51.0)
Hemoglobin: 17.3 g/dL (ref 13.0–17.7)
Immature Grans (Abs): 0 x10E3/uL (ref 0.0–0.1)
Immature Granulocytes: 0 %
Lymphocytes Absolute: 1.5 x10E3/uL (ref 0.7–3.1)
Lymphs: 26 %
MCH: 30.5 pg (ref 26.6–33.0)
MCHC: 33.7 g/dL (ref 31.5–35.7)
MCV: 91 fL (ref 79–97)
Monocytes Absolute: 0.6 x10E3/uL (ref 0.1–0.9)
Monocytes: 10 %
Neutrophils Absolute: 3.5 x10E3/uL (ref 1.4–7.0)
Neutrophils: 61 %
Platelets: 227 x10E3/uL (ref 150–450)
RBC: 5.67 x10E6/uL (ref 4.14–5.80)
RDW: 12.4 % (ref 11.6–15.4)
WBC: 5.7 x10E3/uL (ref 3.4–10.8)

## 2024-07-06 LAB — CMP14+EGFR
ALT: 60 IU/L — ABNORMAL HIGH (ref 0–44)
AST: 41 IU/L — ABNORMAL HIGH (ref 0–40)
Albumin: 4.6 g/dL (ref 3.9–4.9)
Alkaline Phosphatase: 126 IU/L — ABNORMAL HIGH (ref 47–123)
BUN/Creatinine Ratio: 13 (ref 10–24)
BUN: 16 mg/dL (ref 8–27)
Bilirubin Total: 0.7 mg/dL (ref 0.0–1.2)
CO2: 25 mmol/L (ref 20–29)
Calcium: 9.9 mg/dL (ref 8.6–10.2)
Chloride: 100 mmol/L (ref 96–106)
Creatinine, Ser: 1.22 mg/dL (ref 0.76–1.27)
Globulin, Total: 2.3 g/dL (ref 1.5–4.5)
Glucose: 166 mg/dL — ABNORMAL HIGH (ref 70–99)
Potassium: 4.6 mmol/L (ref 3.5–5.2)
Sodium: 139 mmol/L (ref 134–144)
Total Protein: 6.9 g/dL (ref 6.0–8.5)
eGFR: 65 mL/min/1.73

## 2024-07-06 LAB — ALPHA-GAL PANEL
Allergen Lamb IgE: <0.1 kU/L
Beef IgE: 0.12 kU/L — AB
IgE (Immunoglobulin E), Serum: 34 [IU]/mL (ref 6–495)
O215-IgE Alpha-Gal: <0.1 kU/L
Pork IgE: <0.1 kU/L

## 2024-07-06 LAB — LYME DISEASE SEROLOGY W/REFLEX: Lyme Total Antibody EIA: NEGATIVE

## 2024-07-10 ENCOUNTER — Ambulatory Visit: Payer: Self-pay | Admitting: Family Medicine

## 2024-07-22 ENCOUNTER — Telehealth: Payer: Self-pay | Admitting: Family Medicine

## 2024-07-22 NOTE — Telephone Encounter (Signed)
 Reviewed results with pt per result notes and pt voiced understanding.

## 2024-07-22 NOTE — Telephone Encounter (Signed)
 Copied from CRM [id number]. Topic: Clinical - Lab/Test Results >> Jul 22, 2024 11:50 AM Spencer Stewart wrote: Reason for CRM: Pt called in regarding lab results.   Dettinger, Fonda LABOR, MD to Spencer Stewart Franchot Darina     07/10/24 12:28 PM Please let the patient know that his kidney numbers look good, his liver numbers were slightly elevated which they have been in the past but I would continue to monitor with PCP on those in the future.  Make sure he hydrates and avoids alcohol and Tylenol.  Hemoglobin and platelets look good.  Patient's alpha gal or red meat allergy testing came back equivocal on one of the numbers but otherwise negative, I would not think this means anything significant for him.  His Lyme testing and [facility] spotted fever testing was all normal.   Pt understood, had no further questions.

## 2024-08-01 ENCOUNTER — Ambulatory Visit: Admitting: Family Medicine

## 2024-11-15 ENCOUNTER — Ambulatory Visit: Admitting: Family Medicine

## 2025-01-13 ENCOUNTER — Ambulatory Visit: Payer: Self-pay
